# Patient Record
Sex: Female | Born: 1935 | Race: Black or African American | Hispanic: No | Smoking: Never smoker
Health system: Southern US, Community
[De-identification: ages and names within clinical notes are randomized; demographics above are authoritative.]

## PROBLEM LIST (undated history)

## (undated) DIAGNOSIS — M1990 Unspecified osteoarthritis, unspecified site: Secondary | ICD-10-CM

## (undated) DIAGNOSIS — A159 Respiratory tuberculosis unspecified: Secondary | ICD-10-CM

## (undated) DIAGNOSIS — R011 Cardiac murmur, unspecified: Secondary | ICD-10-CM

## (undated) DIAGNOSIS — I10 Essential (primary) hypertension: Secondary | ICD-10-CM

## (undated) HISTORY — DX: Respiratory tuberculosis unspecified: A15.9

---

## 2011-03-10 ENCOUNTER — Ambulatory Visit: Payer: Self-pay | Admitting: Internal Medicine

## 2011-03-16 ENCOUNTER — Ambulatory Visit (INDEPENDENT_AMBULATORY_CARE_PROVIDER_SITE_OTHER): Payer: Medicare Other | Admitting: Internal Medicine

## 2011-03-16 ENCOUNTER — Encounter: Payer: Self-pay | Admitting: Internal Medicine

## 2011-03-16 VITALS — BP 181/92 | HR 102 | Temp 97.9°F | Ht 66.0 in | Wt 143.0 lb

## 2011-03-16 DIAGNOSIS — M069 Rheumatoid arthritis, unspecified: Secondary | ICD-10-CM | POA: Insufficient documentation

## 2011-03-16 NOTE — Progress Notes (Signed)
  Subjective:    Patient ID: Donna Everett, female    DOB: 03-28-1935, 76 y.o.   MRN: [id number]  HPI Donna Everett is a 76 year old female with a history of rheumatoid arthritis on methotrexate WHO COMES TO THE INFECTIOUS DISEASE CLINIC WITH A POSITIVE QuantiFERON test prior to consideration for treatment with a biologic medicine.  She reports a remote history of TB treatment about 40 years ago.  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  Due to her history, she had a yearly CXR that had remained stable.  No fever, no weight loss, no night sweats.  She has not traveled outside of the country, been in jail or exposed to anyone with TB.      Review of Systems  All other systems reviewed and are negative.       Objective:   Physical Exam  Constitutional: She is oriented to person, place, and time. She appears well-developed and well-nourished.  Cardiovascular: Normal rate, regular rhythm and normal heart sounds.   No murmur heard. Pulmonary/Chest: Effort normal and breath sounds normal. No respiratory distress. She has no wheezes. She has no rales.  Abdominal: Soft. Bowel sounds are normal. There is no tenderness.  Musculoskeletal:       Decreased ROM due to arthritis  Lymphadenopathy:    She has no cervical adenopathy.  Neurological: She is alert and oriented to person, place, and time.          Assessment & Plan:

## 2011-03-16 NOTE — Assessment & Plan Note (Signed)
It is unclear if this positive quantiferon test is due to re\re exposure or if she developed latent tuberculosis since she was last treated. Having the positive test does not guide [place] because it may be secondary to previous infection. With her yearly chest x-ray that she reports that she had that for her job and her history of treatment there is no current indication for treatment for latent tuberculosis prior to anti-TNF therapy. Additionally, the patient is reluctant to start anti-TNF therapy and tells me today that she will not be taking it. Regardless, I do not recommend 9 months of INH therapy for latent tuberculosis based on that history. If she does decide to have anti-TNF therapy, she should have a chest x-ray prior to starting her therapy. I have discussed this with the patient at length and she will again discuss with her rheumatologist whether or not she wants to pursue anti-TNF therapy.

## 2020-04-04 ENCOUNTER — Emergency Department (HOSPITAL_COMMUNITY): Payer: Medicare Other

## 2020-04-04 ENCOUNTER — Observation Stay (HOSPITAL_COMMUNITY)
Admission: EM | Admit: 2020-04-04 | Discharge: 2020-04-08 | Disposition: A | Discharge status: Home / Self Care | Payer: Medicare Other | Attending: Internal Medicine | Admitting: Internal Medicine

## 2020-04-04 ENCOUNTER — Other Ambulatory Visit: Payer: Self-pay

## 2020-04-04 DIAGNOSIS — M069 Rheumatoid arthritis, unspecified: Secondary | ICD-10-CM | POA: Insufficient documentation

## 2020-04-04 DIAGNOSIS — E440 Moderate protein-calorie malnutrition: Secondary | ICD-10-CM | POA: Diagnosis not present

## 2020-04-04 DIAGNOSIS — Z79899 Other long term (current) drug therapy: Secondary | ICD-10-CM | POA: Insufficient documentation

## 2020-04-04 DIAGNOSIS — R0789 Other chest pain: Secondary | ICD-10-CM | POA: Diagnosis present

## 2020-04-04 DIAGNOSIS — I10 Essential (primary) hypertension: Secondary | ICD-10-CM

## 2020-04-04 DIAGNOSIS — Z681 Body mass index (BMI) 19 or less, adult: Secondary | ICD-10-CM | POA: Insufficient documentation

## 2020-04-04 DIAGNOSIS — Z20822 Contact with and (suspected) exposure to covid-19: Secondary | ICD-10-CM | POA: Insufficient documentation

## 2020-04-04 DIAGNOSIS — R911 Solitary pulmonary nodule: Secondary | ICD-10-CM

## 2020-04-04 DIAGNOSIS — R011 Cardiac murmur, unspecified: Secondary | ICD-10-CM

## 2020-04-04 DIAGNOSIS — R079 Chest pain, unspecified: Secondary | ICD-10-CM | POA: Diagnosis not present

## 2020-04-04 HISTORY — DX: Cardiac murmur, unspecified: R01.1

## 2020-04-04 HISTORY — DX: Unspecified osteoarthritis, unspecified site: M19.90

## 2020-04-04 HISTORY — DX: Essential (primary) hypertension: I10

## 2020-04-04 LAB — CBC
HCT: 39.8 % (ref 36.0–46.0)
Hemoglobin: 12.3 g/dL (ref 12.0–15.0)
MCH: 28.5 pg (ref 26.0–34.0)
MCHC: 30.9 g/dL (ref 30.0–36.0)
MCV: 92.1 fL (ref 80.0–100.0)
Platelets: 287 10*3/uL (ref 150–400)
RBC: 4.32 MIL/uL (ref 3.87–5.11)
RDW: 12.5 % (ref 11.5–15.5)
WBC: 5.2 10*3/uL (ref 4.0–10.5)
nRBC: 0 % (ref 0.0–0.2)

## 2020-04-04 LAB — D-DIMER, QUANTITATIVE: D-Dimer, Quant: 3.31 ug/mL-FEU — ABNORMAL HIGH (ref 0.00–0.50)

## 2020-04-04 LAB — BASIC METABOLIC PANEL
Anion gap: 11 (ref 5–15)
BUN: 22 mg/dL (ref 8–23)
CO2: 26 mmol/L (ref 22–32)
Calcium: 10.3 mg/dL (ref 8.9–10.3)
Chloride: 100 mmol/L (ref 98–111)
Creatinine, Ser: 0.58 mg/dL (ref 0.44–1.00)
GFR calc Af Amer: >60 mL/min (ref >60)
GFR calc non Af Amer: >60 mL/min (ref >60)
Glucose, Bld: 127 mg/dL — ABNORMAL HIGH (ref 70–99)
Potassium: 3.8 mmol/L (ref 3.5–5.1)
Sodium: 137 mmol/L (ref 135–145)

## 2020-04-04 LAB — TROPONIN I (HIGH SENSITIVITY)
Troponin I (High Sensitivity): 3 ng/L (ref <18)
Troponin I (High Sensitivity): 6 ng/L (ref <18)

## 2020-04-04 MED ORDER — IOHEXOL 350 MG/ML SOLN
75.0000 mL | Freq: Once | INTRAVENOUS | Status: AC | PRN
  Administered 2020-04-04: 75 mL via INTRAVENOUS

## 2020-04-04 NOTE — ED Provider Notes (Signed)
[facility] EMERGENCY DEPARTMENT Provider Note   CSN: [id number] Arrival date & time: 04/04/20  1214     History No chief complaint on file.   Donna Everett is a 85 y.o. female.   Chest Pain Pain location:  L chest and substernal area Pain quality: pressure and sharp   Pain radiates to:  Does not radiate Pain severity:  Moderate Onset quality:  Sudden Timing:  Intermittent Progression:  Waxing and waning Chronicity:  New Context: at rest   Context: not lifting   Relieved by:  Nothing Worsened by:  Nothing Ineffective treatments:  None tried Associated symptoms: nausea   Associated symptoms: no back pain, no cough, no dizziness, no fever, no headache, no palpitations, no shortness of breath and no vomiting   Risk factors: hypertension        Past Medical History:  Diagnosis Date  . Arthritis   . Heart murmur   . Hypertension   . Tuberculosis     Patient Active Problem List   Diagnosis Date Noted  . Chest pain 04/05/2020  . Heart murmur 04/05/2020  . Pulmonary nodule 04/05/2020  . Essential hypertension 04/05/2020  . Rheumatoid arthritis (HCC) 03/16/2011    History reviewed. No pertinent surgical history.   OB History   No obstetric history on file.     History reviewed. No pertinent family history.  Social History   Tobacco Use  . Smoking status: Never Smoker  . Smokeless tobacco: Never Used  Substance Use Topics  . Alcohol use: Never    Comment: occasional wine  . Drug use: No    Home Medications Prior to Admission medications   Medication Sig Start Date End Date Taking? Authorizing Provider  acetaminophen (TYLENOL) 325 MG tablet Take 650 mg by mouth every 6 (six) hours as needed (for pain).   Yes [provider]  aspirin EC 81 MG tablet Take 81 mg by mouth in the morning. Swallow whole.   Yes [provider]  Cholecalciferol (VITAMIN D3) 50 MCG (2000 UT) TABS Take 2,000 Units by mouth in the  morning.   Yes [provider]  diclofenac Sodium (VOLTAREN) 1 % GEL Apply 2 g topically 2 (two) times daily as needed (to affected area for pain).   Yes [provider]  hydrochlorothiazide (MICROZIDE) 12.5 MG capsule Take 12.5 mg by mouth in the morning.   Yes [provider]  [employer] [facility] EX) Apply 1 application topically See admin instructions. Apply to buttocks 2 times a day- after incontinent care   Yes [provider]  NON FORMULARY Take 90 mLs by mouth See admin instructions. MedPass 2.0: Drink 90 ml's by mouth two times a day   Yes [provider]  polyethylene glycol powder (GLYCOLAX/MIRALAX) 17 GM/SCOOP powder Take 17 g by mouth in the morning.   Yes [provider]  simethicone (MYLICON) 80 MG chewable tablet Chew 80 mg by mouth 3 (three) times daily as needed for flatulence (BEFORE MEALS).   Yes [provider]  tizanidine (ZANAFLEX) 2 MG capsule Take 2 mg by mouth at bedtime.   Yes [provider]  trolamine salicylate (ASPERCREME) 10 % cream Apply 1 application topically as needed for muscle pain.   Yes [provider]  folic acid (FOLVITE) 1 MG tablet Take 1 mg by mouth daily.   Patient not taking: Reported on 04/04/2020    [provider]  methotrexate (RHEUMATREX) 15 MG tablet Take 15 mg by mouth  once a week. Caution: Chemotherapy. Protect from light.  Patient not taking: Reported on 04/04/2020    [provider]    Allergies    Erythromycin base, Ascorbate, Aspirin, Doxycycline, Nickel, Codeine, Caffeine, and Ibuprofen  Review of Systems   Review of Systems  Constitutional: Negative for chills and fever.  HENT: Negative for congestion and rhinorrhea.   Respiratory: Negative for cough and shortness of breath.   Cardiovascular: Positive for chest pain. Negative for palpitations.  Gastrointestinal: Positive for nausea. Negative for diarrhea and vomiting.    Genitourinary: Negative for difficulty urinating and dysuria.  Musculoskeletal: Negative for arthralgias and back pain.  Skin: Negative for rash and wound.  Neurological: Negative for dizziness, light-headedness and headaches.    Physical Exam Updated Vital Signs BP (!) 146/76 (BP Location: Right Arm)   Pulse 100   Temp 98.5 F (36.9 C) (Oral)   Resp 16   Ht 5\' 5"  (1.651 m)   Wt 52.4 kg   SpO2 98%   BMI 19.22 kg/m   Physical Exam Vitals and nursing note reviewed. Exam conducted with a chaperone present.  Constitutional:      General: She is not in acute distress.    Appearance: Normal appearance.  HENT:     Head: Normocephalic and atraumatic.     Nose: No rhinorrhea.  Eyes:     General:        Right eye: No discharge.        Left eye: No discharge.     Conjunctiva/sclera: Conjunctivae normal.  Cardiovascular:     Rate and Rhythm: Normal rate and regular rhythm.     Heart sounds: Murmur (2/6 right sternal boarder) heard.   Pulmonary:     Effort: Pulmonary effort is normal. No respiratory distress.     Breath sounds: No stridor. No wheezing.  Abdominal:     General: Abdomen is flat. There is no distension.     Palpations: Abdomen is soft.  Musculoskeletal:        General: No tenderness or signs of injury.  Skin:    General: Skin is warm and dry.  Neurological:     General: No focal deficit present.     Mental Status: She is alert. Mental status is at baseline.     Motor: No weakness.  Psychiatric:        Mood and Affect: Mood normal.        Behavior: Behavior normal.     ED Results / Procedures / Treatments   Labs (all labs ordered are listed, but only abnormal results are displayed) Labs Reviewed  BASIC METABOLIC PANEL - Abnormal; Notable for the following components:      Result Value   Glucose, Bld 127 (*)    All other components within normal limits  D-DIMER, QUANTITATIVE (NOT AT [facility]) - Abnormal; Notable for the following components:   D-Dimer,  Quant 3.31 (*)    All other components within normal limits  SARS CORONAVIRUS 2 BY RT PCR (HOSPITAL ORDER, PERFORMED IN [facility])  CBC  CBC  CREATININE, SERUM  LIPID PANEL  TROPONIN I (HIGH SENSITIVITY)  TROPONIN I (HIGH SENSITIVITY)  TROPONIN I (HIGH SENSITIVITY)  TROPONIN I (HIGH SENSITIVITY)    EKG EKG Interpretation  Date/Time:  Sunday April 04 2020 12:12:38 EDT Ventricular Rate:  107 PR Interval:  146 QRS Duration: 124 QT Interval:  362 QTC Calculation: 483 R Axis:   60 Text Interpretation: Sinus tachycardia Right atrial enlargement Left bundle  branch block Abnormal ECG Confirmed by Cherlynn Perches ([id number]) on 04/04/2020 7:11:19 PM   Radiology DG Chest 2 View  Result Date: 04/04/2020 CLINICAL DATA:  Increased heart rate this morning. Intermittent left-sided chest and axillary pain. No shortness of breath. EXAM: CHEST - 2 VIEW COMPARISON:  None. FINDINGS: The heart, hila, and mediastinum are normal. No pneumothorax. No nodules or masses. Increased interstitial opacities in the lungs are age indeterminate. No focal infiltrate. IMPRESSION: Increased interstitial opacities in the lungs are age indeterminate. Atypical infection could have this appearance. Recommend clinical correlation and attention on short-term follow-up. Electronically Signed   By: Gerome Sam III M.D   On: 04/04/2020 13:02   CT Angio Chest PE W and/or Wo Contrast  Result Date: 04/04/2020 CLINICAL DATA:  Tachycardia, chest pain, elevated D-dimer EXAM: CT ANGIOGRAPHY CHEST WITH CONTRAST TECHNIQUE: Multidetector CT imaging of the chest was performed using the standard protocol during bolus administration of intravenous contrast. Multiplanar CT image reconstructions and MIPs were obtained to evaluate the vascular anatomy. CONTRAST:  9mL OMNIPAQUE IOHEXOL 350 MG/ML SOLN COMPARISON:  None. FINDINGS: Cardiovascular: There is excellent opacification of the pulmonary arterial tree. No intraluminal  filling defect identified to suggest acute pulmonary embolism. The central pulmonary arteries are markedly enlarged in keeping with changes of pulmonary arterial hypertension. There is marked left ventricular hypertrophy with resultant mild global cardiomegaly. No significant coronary artery calcification. No pericardial effusion. The thoracic aorta is of normal caliber. Mild atherosclerotic calcification noted within its descending segment. 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 Arch vasculature is widely patent proximally. Mediastinum/Nodes: No pathologic thoracic adenopathy. Lungs/Pleura: There is bronchiectasis and architectural distortion involving the lingula and left lower lobe with air trapping within the left lower lobe likely related to small airways disease. The focality of these changes suggest a post infectious etiology. Bilateral upper lobe bronchiectasis is also seen time a left severe. No associated nodularity or focal pulmonary infiltrate. A cavitary nodule is seen within the left apex, 9 mm, axial image # 22/6, demonstrating a thin uniform rind, nonspecific. While this may represent a a post infectious or post inflammatory cavity, a cavitary neoplasm is difficult to exclude. 3 mm noncalcified indeterminate pulmonary nodule, right upper lobe, axial image # 69/6. Upper Abdomen: No acute abnormality. Musculoskeletal: No acute bone abnormality Review of the MIP images confirms the above findings. IMPRESSION: No pulmonary embolism. Marked left ventricular hypertrophy. Marked pulmonary arterial enlargement suggesting changes of pulmonary artery hypertension, possibly related to diastolic dysfunction. Multifocal areas of bronchiectasis, most severe within the left lower lobe, likely post infectious in etiology. No superimposed active pulmonary infiltrate identified. Multiple pulmonary nodules. 9 mm cavitary  nodule within the left apex may be post infectious or inflammatory in nature, however, a cavitary neoplasm should be excluded. Consider one of the following in 3 months for both low-risk and high-risk individuals: (a) repeat chest CT, (b) follow-up PET-CT, or (c) tissue sampling. Note that attempt at percutaneous tissue sampling may be unlikely to yield a diagnostic sample given the lack of significant associated soft tissue. This recommendation follows the consensus statement: Guidelines for Management of Incidental Pulmonary Nodules Detected on CT Images: From the Fleischner Society 2017; Radiology 2017; 284:228-243. Aortic Atherosclerosis (ICD10-I70.0). Electronically Signed   By: Helyn Numbers MD   On: 04/04/2020 23:25    Procedures Procedures (including critical care time)  Medications Ordered in ED Medications  hydrochlorothiazide (MICROZIDE) capsule 12.5  mg (12.5 mg Oral Given 04/05/20 0631)  acetaminophen (TYLENOL) tablet 650 mg (has no administration in time range)  ondansetron (ZOFRAN) injection 4 mg (has no administration in time range)  enoxaparin (LOVENOX) injection 40 mg (40 mg Subcutaneous Given 04/05/20 0631)  iohexol (OMNIPAQUE) 350 MG/ML injection 75 mL (75 mLs Intravenous Contrast Given 04/04/20 2256)    ED Course  I have reviewed the triage vital signs and the nursing notes.  Pertinent labs & imaging results that were available during my care of the patient were reviewed by me and considered in my medical decision making (see chart for details).    MDM Rules/Calculators/A&P                          Sudden onset left and mid pressure felt some nausea with no diaphoresis, EKG shows left bundle branch block, based on modified sgarbossa criteria I do not see any signs of new ischemic change, and review of the medical record shows previous left bundle branch block noted on EKG in 2019.  Hear score 5 based on risk factors, troponin III than 6, history of PE secondary to birth  control pills when she was young lady, no leg swelling but initial characteristic of sharp pain could be PE we will get a D-dimer.  My concern is that she could have underlying cardiovascular disease and may need further work-up.  Previous echo noted no significant valvular disease however today I hear a systolic murmur, she may have aortic stenosis.  No infectious signs or symptoms.  Other labs unremarkable.  Chest x-ray reviewed by me and radiology shows no acute abnormality.  Patient's D-dimer is markedly elevated a CT PE study was ordered and reviewed by radiology myself. CT scan shows chronic changes to include the above report. She is made aware of all these findings. With her 2 troponins and hear score of 5, I would recommend admission for further provocative testing and evaluation, she has chronic EKG changes that I cannot compared to prior. She has no cardiology follow-up. I consulted the hospitalist the hospitalist agrees to admit the patient.  Final Clinical Impression(s) / ED Diagnoses Final diagnoses:  Chest pain, unspecified type    Rx / DC Orders ED Discharge Orders    None       Sabino Donovan, MD 04/05/20 [phone]

## 2020-04-04 NOTE — ED Triage Notes (Signed)
Pt to triage via [facility] from [facility].  Reports increased HR this morning and intermittent L sided chest/axillary pain. Denies pain at present.  Denies SOB.  Reported nausea that was relieved with Zofran 4mg  IV by EMS.  22g R hand.

## 2020-04-04 NOTE — ED Notes (Addendum)
Pt back to triage for repeat Trop.  Denies chest pain.  Delay for treatment room explained to pt.

## 2020-04-04 NOTE — ED Notes (Signed)
Patient transported to CT 

## 2020-04-05 ENCOUNTER — Encounter (HOSPITAL_COMMUNITY): Payer: Self-pay | Admitting: Family Medicine

## 2020-04-05 ENCOUNTER — Observation Stay (HOSPITAL_COMMUNITY): Payer: Medicare Other

## 2020-04-05 ENCOUNTER — Observation Stay (HOSPITAL_BASED_OUTPATIENT_CLINIC_OR_DEPARTMENT_OTHER): Payer: Medicare Other

## 2020-04-05 DIAGNOSIS — R079 Chest pain, unspecified: Secondary | ICD-10-CM

## 2020-04-05 DIAGNOSIS — R011 Cardiac murmur, unspecified: Secondary | ICD-10-CM | POA: Diagnosis not present

## 2020-04-05 DIAGNOSIS — M069 Rheumatoid arthritis, unspecified: Secondary | ICD-10-CM

## 2020-04-05 DIAGNOSIS — I10 Essential (primary) hypertension: Secondary | ICD-10-CM

## 2020-04-05 DIAGNOSIS — R911 Solitary pulmonary nodule: Secondary | ICD-10-CM | POA: Diagnosis not present

## 2020-04-05 LAB — CBC
HCT: 37.7 % (ref 36.0–46.0)
Hemoglobin: 12.1 g/dL (ref 12.0–15.0)
MCH: 29.7 pg (ref 26.0–34.0)
MCHC: 32.1 g/dL (ref 30.0–36.0)
MCV: 92.6 fL (ref 80.0–100.0)
Platelets: 196 10*3/uL (ref 150–400)
RBC: 4.07 MIL/uL (ref 3.87–5.11)
RDW: 12.7 % (ref 11.5–15.5)
WBC: 4.5 10*3/uL (ref 4.0–10.5)
nRBC: 0 % (ref 0.0–0.2)

## 2020-04-05 LAB — ECHOCARDIOGRAM COMPLETE
Area-P 1/2: 5.31 cm2
Calc EF: 47.6 %
Height: 65 in
S' Lateral: 2.8 cm
Single Plane A2C EF: 53.3 %
Single Plane A4C EF: 41.5 %
Weight: 1848.34 oz

## 2020-04-05 LAB — LIPID PANEL
Cholesterol: 151 mg/dL (ref 0–200)
HDL: 59 mg/dL (ref >40)
LDL Cholesterol: 80 mg/dL (ref 0–99)
Total CHOL/HDL Ratio: 2.6 RATIO
Triglycerides: 61 mg/dL (ref <150)
VLDL: 12 mg/dL (ref 0–40)

## 2020-04-05 LAB — TROPONIN I (HIGH SENSITIVITY)
Troponin I (High Sensitivity): 6 ng/L (ref <18)
Troponin I (High Sensitivity): 6 ng/L (ref <18)

## 2020-04-05 LAB — SARS CORONAVIRUS 2 BY RT PCR (HOSPITAL ORDER, PERFORMED IN ~~LOC~~ HOSPITAL LAB): SARS Coronavirus 2: NEGATIVE

## 2020-04-05 LAB — CREATININE, SERUM
Creatinine, Ser: 0.64 mg/dL (ref 0.44–1.00)
GFR calc Af Amer: >60 mL/min (ref >60)
GFR calc non Af Amer: >60 mL/min (ref >60)

## 2020-04-05 MED ORDER — ACETAMINOPHEN 325 MG PO TABS: 650.0000 mg | ORAL_TABLET | ORAL | Status: DC | PRN

## 2020-04-05 MED ORDER — HYDROCHLOROTHIAZIDE 12.5 MG PO CAPS
12.5000 mg | ORAL_CAPSULE | Freq: Every morning | ORAL | Status: DC
  Administered 2020-04-05 – 2020-04-08 (×4): 12.5 mg via ORAL
  Filled 2020-04-05 (×4): qty 1

## 2020-04-05 MED ORDER — ENOXAPARIN SODIUM 40 MG/0.4ML ~~LOC~~ SOLN
40.0000 mg | SUBCUTANEOUS | Status: DC
  Administered 2020-04-05 – 2020-04-08 (×4): 40 mg via SUBCUTANEOUS
  Filled 2020-04-05 (×4): qty 0.4

## 2020-04-05 MED ORDER — ONDANSETRON HCL 4 MG/2ML IJ SOLN
4.0000 mg | Freq: Four times a day (QID) | INTRAMUSCULAR | Status: DC | PRN
  Administered 2020-04-07: 4 mg via INTRAVENOUS
  Filled 2020-04-05: qty 2

## 2020-04-05 MED ORDER — ASPIRIN EC 81 MG PO TBEC
81.0000 mg | DELAYED_RELEASE_TABLET | Freq: Every morning | ORAL | Status: DC
  Administered 2020-04-06 – 2020-04-08 (×3): 81 mg via ORAL
  Filled 2020-04-05 (×3): qty 1

## 2020-04-05 NOTE — Progress Notes (Signed)
PROGRESS NOTE    Donna Everett  ONG:[id number] DOB: 13-Oct-1934 DOA: 04/04/2020 PCP: Patient, No Pcp Per    Brief Narrative:  Donna Everett is an 85 year old female with past medical history notable for rheumatoid arthritis, essential hypertension who presented to the ED via EMS from [facility] long-term [facility] facility with complaints of chest pressure.  Notes substernal chest pressure without radiation that is dull in nature with associated shortness of breath, nausea.  Pain has been persistent since onset.  Reports stress test many years ago but no history of cardiac catheterization.  Patient currently resident of long-term care at [facility] since April 2021 for generalized weakness and difficulty caring for herself at home.  In the ED, BP 150/78, HR 95, RR 19, SPO2 99% on room air.  Sodium 137, potassium 3.8, BUN 22, creatinine 0.58, glucose 127.  WBC count 4.5, hemoglobin 12.1, platelets 166.  Covid-19 PCR negative.  Troponin VI, 6, within normal limits.  D-dimer elevated 3.31.  CT angiogram chest negative for PE but with multiple pulmonary nodules, largest 9 mm cavitary nodule left apex consistent with postinfectious versus inflammatory versus cavitary neoplasm.  TRH consulted for further evaluation and management of chest pain.   Assessment & Plan:   Principal Problem:   Chest pain Active Problems:   Rheumatoid arthritis (HCC)   Heart murmur   Pulmonary nodule   Essential hypertension   Atypical chest pain Patient presenting to the ED via EMS following acute onset chest pressure without radiation that has been persistent.  EKG with left bundle branch block which is old and no ST elevation/depression or concerning T wave inversion.  Troponin have been negative.  EKG unrevealing.  Lipid panel with total cholesterol 151, HDL 59, LDL 80, triglycerides 61. --Echocardiogram: Pending --Nuclear med stress test: Pending --Continue aspirin 81 mg p.o. daily --Continue monitor  on telemetry  Pulmonary nodules CT angiogram chest notable for multiple pulmonary nodules, largest 9 mm cavitary left apex consistent with postinfectious versus inflammatory versus cavitary neoplasm.  Patient well aware of her pulmonary nodules, history of sarcoidosis has been followed in the distant past by her PCP.  Radiology recommending interval follow-up 101-month CT chest versus PET CT versus tissue sample.  But unable to perform percutaneous sample due to lack of soft tissue per their report. --Will place ambulatory referral to pulmonology for further evaluation/surveillance following discharge  Essential hypertension --Continue HCTZ 12.5 mg p.o. daily  Generalized weakness, debility: Patient has been a resident of [facility] since April 2021 for generalized weakness, debility and unable to care for self.  She states therapy has not worked with her over the past 1-2 months; and she would like to get more ambulatory. --PT/OT evaluation  Moderate protein calorie malnutrition Body mass index is 19.22 kg/m. --Nutrition consult   DVT prophylaxis: SCDs, Lovenox Code Status: Full code Family Communication: Updated patient extensively at bedside  Disposition Plan:  Status is: Observation  The patient remains OBS appropriate and will d/c before 2 midnights.  Dispo: The patient is from: SNF              Anticipated d/c is to: SNF              Anticipated d/c date is: 1 day              Patient currently is not medically stable to d/c.   Consultants:   None  Procedures:   Transthoracic echocardiogram: Pending  Nuclear medicine stress test: Pending  Antimicrobials:  None   Subjective: Patient seen and examined at bedside, resting comfortably.  Denies any current chest pain at this time.  Awaiting TTE and nuclear medicine stress test.  Patient highly concerned about lack of therapy given at [facility] currently, she is noticed a lack of assistance with decreased  staffing since Covid-19 surge which is currently ongoing.  No other complaints or concerns at this time.  Denies headache, no dizziness, no chest pain, no shortness of breath, no palpitations, no abdominal pain.  No acute events overnight per nursing staff.  Objective: Vitals:   04/05/20 0145 04/05/20 0207 04/05/20 0242 04/05/20 0536  BP: 117/60  140/74 (!) 146/76  Pulse: 72  72 100  Resp: 13  16 16   Temp:  97.7 F (36.5 C) 97.7 F (36.5 C) 98.5 F (36.9 C)  TempSrc:  Oral Oral Oral  SpO2: 100%  100% 98%  Weight:   52.4 kg   Height:       No intake or output data in the 24 hours ending 04/05/20 1110 Filed Weights   04/04/20 1607 04/05/20 0242  Weight: 52.6 kg 52.4 kg    Examination:  General exam: Appears calm and comfortable, thin in appearance Respiratory system: Clear to auscultation. Respiratory effort normal.  Oxygenating well on room air Cardiovascular system: S1 & S2 heard, RRR. No JVD, murmurs, rubs, gallops or clicks. No pedal edema. Gastrointestinal system: Abdomen is nondistended, soft and nontender. No organomegaly or masses felt. Normal bowel sounds heard. Central nervous system: Alert and oriented. No focal neurological deficits. Extremities: Symmetric 5 x 5 power. Skin: No rashes, lesions or ulcers Psychiatry: Judgement and insight appear normal. Mood & affect appropriate.     Data Reviewed: I have personally reviewed following labs and imaging studies  CBC: Recent Labs  Lab 04/04/20 1240 04/05/20 0246  WBC 5.2 4.5  HGB 12.3 12.1  HCT 39.8 37.7  MCV 92.1 92.6  PLT 287 196   Basic Metabolic Panel: Recent Labs  Lab 04/04/20 1240 04/05/20 0246  NA 137  --   K 3.8  --   CL 100  --   CO2 26  --   GLUCOSE 127*  --   BUN 22  --   CREATININE 0.58 0.64  CALCIUM 10.3  --    GFR: Estimated Creatinine Clearance: 44.1 mL/min (by C-G formula based on SCr of 0.64 mg/dL). Liver Function Tests: No results for input(s): AST, ALT, ALKPHOS, BILITOT, PROT,  ALBUMIN in the last 168 hours. No results for input(s): LIPASE, AMYLASE in the last 168 hours. No results for input(s): AMMONIA in the last 168 hours. Coagulation Profile: No results for input(s): INR, PROTIME in the last 168 hours. Cardiac Enzymes: No results for input(s): CKTOTAL, CKMB, CKMBINDEX, TROPONINI in the last 168 hours. BNP (last 3 results) No results for input(s): PROBNP in the last 8760 hours. HbA1C: No results for input(s): HGBA1C in the last 72 hours. CBG: No results for input(s): GLUCAP in the last 168 hours. Lipid Profile: Recent Labs    04/05/20 0246  CHOL 151  HDL 59  LDLCALC 80  TRIG 61  CHOLHDL 2.6   Thyroid Function Tests: No results for input(s): TSH, T4TOTAL, FREET4, T3FREE, THYROIDAB in the last 72 hours. Anemia Panel: No results for input(s): VITAMINB12, FOLATE, FERRITIN, TIBC, IRON, RETICCTPCT in the last 72 hours. Sepsis Labs: No results for input(s): PROCALCITON, LATICACIDVEN in the last 168 hours.  Recent Results (from the past 240 hour(s))  SARS Coronavirus 2 by RT PCR (  hospital order, performed in [facility] hospital lab) Nasopharyngeal Nasopharyngeal Swab     Status: None   Collection Time: 04/05/20 12:05 AM   Specimen: Nasopharyngeal Swab  Result Value Ref Range Status   SARS Coronavirus 2 NEGATIVE NEGATIVE Final    Comment: (NOTE) SARS-CoV-2 target nucleic acids are NOT DETECTED.  The SARS-CoV-2 RNA is generally detectable in upper and lower respiratory specimens during the acute phase of infection. The lowest concentration of SARS-CoV-2 viral copies this assay can detect is 250 copies / mL. A negative result does not preclude SARS-CoV-2 infection and should not be used as the sole basis for treatment or other patient management decisions.  A negative result may occur with improper specimen collection / handling, submission of specimen other than nasopharyngeal swab, presence of viral mutation(s) within the areas targeted by this  assay, and inadequate number of viral copies (<250 copies / mL). A negative result must be combined with clinical observations, patient history, and epidemiological information.  Fact Sheet for Patients:   BoilerBrush.com.cy  Fact Sheet for Healthcare Providers: https://pope.com/  This test is not yet approved or  cleared by the [place] FDA and has been authorized for detection and/or diagnosis of SARS-CoV-2 by FDA under an Emergency Use Authorization (EUA).  This EUA will remain in effect (meaning this test can be used) for the duration of the COVID-19 declaration under Section 564(b)(1) of the Act, 21 U.S.C. section 360bbb-3(b)(1), unless the authorization is terminated or revoked sooner.  Performed at [facility], [address] [place], [place] [address]          Radiology Studies: DG Chest 2 View  Result Date: 04/04/2020 CLINICAL DATA:  Increased heart rate this morning. Intermittent left-sided chest and axillary pain. No shortness of breath. EXAM: CHEST - 2 VIEW COMPARISON:  None. FINDINGS: The heart, hila, and mediastinum are normal. No pneumothorax. No nodules or masses. Increased interstitial opacities in the lungs are age indeterminate. No focal infiltrate. IMPRESSION: Increased interstitial opacities in the lungs are age indeterminate. Atypical infection could have this appearance. Recommend clinical correlation and attention on short-term follow-up. Electronically Signed   By: Gerome Sam III M.D   On: 04/04/2020 13:02   CT Angio Chest PE W and/or Wo Contrast  Result Date: 04/04/2020 CLINICAL DATA:  Tachycardia, chest pain, elevated D-dimer EXAM: CT ANGIOGRAPHY CHEST WITH CONTRAST TECHNIQUE: Multidetector CT imaging of the chest was performed using the standard protocol during bolus administration of intravenous contrast. Multiplanar CT image reconstructions and MIPs were obtained to evaluate the vascular  anatomy. CONTRAST:  63mL OMNIPAQUE IOHEXOL 350 MG/ML SOLN COMPARISON:  None. FINDINGS: Cardiovascular: There is excellent opacification of the pulmonary arterial tree. No intraluminal filling defect identified to suggest acute pulmonary embolism. The central pulmonary arteries are markedly enlarged in keeping with changes of pulmonary arterial hypertension. There is marked left ventricular hypertrophy with resultant mild global cardiomegaly. No significant coronary artery calcification. No pericardial effusion. The thoracic aorta is of normal caliber. Mild atherosclerotic calcification noted within its descending segment. 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 Arch vasculature is widely patent proximally. Mediastinum/Nodes: No pathologic thoracic adenopathy. Lungs/Pleura: There is bronchiectasis and architectural distortion involving the lingula and left lower lobe with air trapping within the left lower lobe likely related to small airways disease. The focality of these changes suggest a post infectious etiology. Bilateral upper lobe bronchiectasis is also seen time  a left severe. No associated nodularity or focal pulmonary infiltrate. A cavitary nodule is seen within the left apex, 9 mm, axial image # 22/6, demonstrating a thin uniform rind, nonspecific. While this may represent a a post infectious or post inflammatory cavity, a cavitary neoplasm is difficult to exclude. 3 mm noncalcified indeterminate pulmonary nodule, right upper lobe, axial image # 69/6. Upper Abdomen: No acute abnormality. Musculoskeletal: No acute bone abnormality Review of the MIP images confirms the above findings. IMPRESSION: No pulmonary embolism. Marked left ventricular hypertrophy. Marked pulmonary arterial enlargement suggesting changes of pulmonary artery hypertension, possibly related to diastolic dysfunction. Multifocal areas of  bronchiectasis, most severe within the left lower lobe, likely post infectious in etiology. No superimposed active pulmonary infiltrate identified. Multiple pulmonary nodules. 9 mm cavitary nodule within the left apex may be post infectious or inflammatory in nature, however, a cavitary neoplasm should be excluded. Consider one of the following in 3 months for both low-risk and high-risk individuals: (a) repeat chest CT, (b) follow-up PET-CT, or (c) tissue sampling. Note that attempt at percutaneous tissue sampling may be unlikely to yield a diagnostic sample given the lack of significant associated soft tissue. This recommendation follows the consensus statement: Guidelines for Management of Incidental Pulmonary Nodules Detected on CT Images: From the Fleischner Society 2017; Radiology 2017; 284:228-243. Aortic Atherosclerosis (ICD10-I70.0). Electronically Signed   By: Helyn Numbers MD   On: 04/04/2020 23:25        Scheduled Meds:  enoxaparin (LOVENOX) injection  40 mg Subcutaneous Q24H   hydrochlorothiazide  12.5 mg Oral q AM   Continuous Infusions:   LOS: 0 days    Time spent: 37 minutes spent on chart review, discussion with nursing staff, consultants, updating family and interview/physical exam; more than 50% of that time was spent in counseling and/or coordination of care.    Alvira Philips [place], DO [facility] Available via Epic secure chat 7am-7pm After these hours, please refer to coverage provider listed on [email or web address] 04/05/2020, 11:10 AM

## 2020-04-05 NOTE — NC FL2 (Signed)
  [place] MEDICAID FL2 LEVEL OF CARE SCREENING TOOL     IDENTIFICATION  Patient Name: Donna Everett Birthdate: 11-Feb-1935 Sex: female Admission Date (Current Location): 04/04/2020  [facility] and [place] Number:  Producer, television/film/video and Address:  [facility], [address] [place], [place] [address]      Provider Number: [id number]  Attending Physician Name and Address:  [place], Alvira Philips, DO  Relative Name and Phone Number:       Current Level of Care: Hospital Recommended Level of Care: Skilled Nursing Facility Prior Approval Number:    Date Approved/Denied:   PASRR Number: [id number]  Discharge Plan: SNF    Current Diagnoses: Patient Active Problem List   Diagnosis Date Noted  . Chest pain 04/05/2020  . Heart murmur 04/05/2020  . Pulmonary nodule 04/05/2020  . Essential hypertension 04/05/2020  . Rheumatoid arthritis (HCC) 03/16/2011    Orientation RESPIRATION BLADDER Height & Weight     Self, Time, Situation, Place  Normal Incontinent, External catheter Weight: 115 lb 8.3 oz (52.4 kg) Height:  5\' 5"  (165.1 cm)  BEHAVIORAL SYMPTOMS/MOOD NEUROLOGICAL BOWEL NUTRITION STATUS      Continent Diet (see discharge summary)  AMBULATORY STATUS COMMUNICATION OF NEEDS Skin   Extensive Assist Verbally Normal (dry;flaky)                       Personal Care Assistance Level of Assistance  Bathing, Feeding, Dressing Bathing Assistance: Maximum assistance Feeding assistance: Independent Dressing Assistance: Maximum assistance     Functional Limitations Info  Sight, Hearing, Speech Sight Info: Adequate Hearing Info: Adequate Speech Info: Adequate    SPECIAL CARE FACTORS FREQUENCY  PT (By licensed PT), OT (By licensed OT)     PT Frequency: 5x week OT Frequency: 5x week            Contractures Contractures Info: Not present    Additional Factors Info  Code Status, Allergies Code Status Info: Full Code Allergies Info:  Erythromycin Base, Ascorbate, Aspirin, Doxycycline, Nickel, Codeine, Caffeine, Ibuprofen           Current Medications (04/05/2020):  This is the current hospital active medication list Current Facility-Administered Medications  Medication Dose Route Frequency Provider Last Rate Last Admin  . acetaminophen (TYLENOL) tablet 650 mg  650 mg Oral Q4H PRN Chotiner, 04/07/2020, MD      . enoxaparin (LOVENOX) injection 40 mg  40 mg Subcutaneous Q24H Chotiner, Claudean Severance, MD   40 mg at 04/05/20 0631  . hydrochlorothiazide (MICROZIDE) capsule 12.5 mg  12.5 mg Oral q AM Chotiner, 04/07/20, MD   12.5 mg at 04/05/20 0631  . ondansetron (ZOFRAN) injection 4 mg  4 mg Intravenous Q6H PRN Chotiner, 04/07/20, MD         Discharge Medications: Please see discharge summary for a list of discharge medications.  Relevant Imaging Results:  Relevant Lab Results:   Additional Information SS#[id number] [place], [place]

## 2020-04-05 NOTE — H&P (Signed)
History and Physical    Donna Everett CHJ:[id number] DOB: 16-Jun-1935 DOA: 04/04/2020  PCP: Patient, No Pcp Per   Patient coming from: Local skilled nursing facility  Chief Complaint: Chest pressure  HPI: Donna Everett is a 85 y.o. female with medical history significant for arthritis, hypertension presents by EMS from a skilled nursing facility with complaint of chest pressure.  She reports that she has developed a substernal chest pressure that does not radiate.  She reports it is a dull pressure-like sensation like something heavy sitting on her chest.  She has had some mild shortness of breath and nausea associated with the chest pressure.  Symptoms began in the afternoon and is continued into the evening she became concerned and called 911.  She states that she had a stress test many years ago but has never had a cardiac catheterization done.  She has been in rehab since April secondary to generalized weakness and having difficulty caring for self at home.  She reports that due to the Covid 19 pandemic services have not been staffed and this is caused her to be at the rehab center for a prolonged period of time.  She states she has not had any fever, cough, chills, vomiting, diarrhea, abdominal pain, urinary symptoms.  She does not take any medicines for the chest pressure today.  States that she does take an aspirin every day.  She does have a history of a heart murmur but states she has never had an echocardiogram. She denies any tobacco, alcohol, illicit drug use.  ED Course: [place] has negative troponin in the emergency room and an EKG that shows left bundle branch block but does not have any acute ST changes.  She has an elevated heart score and with her symptoms and risk factors hospital service been asked to observe patient and do a more thorough cardiac work-up  Review of Systems:  General: Denies fever, chills, weight loss, night sweats.  Denies dizziness.  Denies change in  appetite HENT: Denies head trauma, headache, denies change in hearing, tinnitus.  Denies nasal congestion or bleeding.  Denies sore throat, sores in mouth.  Denies difficulty swallowing Eyes: Denies blurry vision, pain in eye, drainage.  Denies discoloration of eyes. Neck: Denies pain.  Denies swelling.  Denies pain with movement. Cardiovascular: Reports substernal chest pressure.  No palpitations.  Denies edema.  Denies orthopnea Respiratory: Reports shortness of breath with exertion.  No cough.  Denies wheezing.  Denies sputum production Gastrointestinal: Denies abdominal pain, swelling.  Reports mild nausea chest pressure but no vomiting, diarrhea.  Denies melena.  Denies hematemesis. Musculoskeletal: Denies limitation of movement.  Denies deformity or swelling.  Denies pain.  Denies arthralgias or myalgias. Genitourinary: Denies pelvic pain.  Denies urinary frequency or hesitancy.  Denies dysuria.  Skin: Denies rash.  Denies petechiae, purpura, ecchymosis. Neurological: Denies headache. Denies syncope. Denies seizure activity. Denies paresthesia. Denies slurred speech, drooping face.  Denies visual change. Psychiatric: Denies depression, anxiety.  Denies suicidal thoughts or ideation.  Denies hallucinations.  Past Medical History:  Diagnosis Date  . Arthritis   . Heart murmur   . Hypertension   . Tuberculosis     History reviewed. No pertinent surgical history.  Social History  reports that she has never smoked. She has never used smokeless tobacco. She reports that she does not drink alcohol and does not use drugs.  Allergies  Allergen Reactions  . Erythromycin Base Swelling and Other (See Comments)    Edema in hands,  not throat  . Ascorbate Other (See Comments)    Urinary frequency  . Aspirin Other (See Comments)    "Allergic," per MAR  . Doxycycline Other (See Comments)    "Stomach spasms, sick , light-headed"  . Nickel Dermatitis  . Codeine Other (See Comments)     "Allergic," per MAR  . Caffeine Anxiety and Other (See Comments)    "very jittery"  . Ibuprofen Other (See Comments)    Cannot take due to liver problems, hepatitis    History reviewed. No pertinent family history.   Prior to Admission medications   Medication Sig Start Date End Date Taking? Authorizing Provider  acetaminophen (TYLENOL) 325 MG tablet Take 650 mg by mouth every 6 (six) hours as needed (for pain).   Yes [provider]  aspirin EC 81 MG tablet Take 81 mg by mouth in the morning. Swallow whole.   Yes [provider]  Cholecalciferol (VITAMIN D3) 50 MCG (2000 UT) TABS Take 2,000 Units by mouth in the morning.   Yes [provider]  diclofenac Sodium (VOLTAREN) 1 % GEL Apply 2 g topically 2 (two) times daily as needed (to affected area for pain).   Yes [provider]  hydrochlorothiazide (MICROZIDE) 12.5 MG capsule Take 12.5 mg by mouth in the morning.   Yes [provider]  Infant Care Products [facility] EX) Apply 1 application topically See admin instructions. Apply to buttocks 2 times a day- after incontinent care   Yes [provider]  NON FORMULARY Take 90 mLs by mouth See admin instructions. MedPass 2.0: Drink 90 ml's by mouth two times a day   Yes [provider]  polyethylene glycol powder (GLYCOLAX/MIRALAX) 17 GM/SCOOP powder Take 17 g by mouth in the morning.   Yes [provider]  simethicone (MYLICON) 80 MG chewable tablet Chew 80 mg by mouth 3 (three) times daily as needed for flatulence (BEFORE MEALS).   Yes [provider]  tizanidine (ZANAFLEX) 2 MG capsule Take 2 mg by mouth at bedtime.   Yes [provider]  trolamine salicylate (ASPERCREME) 10 % cream Apply 1 application topically as needed for muscle pain.   Yes [provider]  folic acid (FOLVITE) 1 MG tablet Take 1 mg by mouth daily.   Patient not taking: Reported on 04/04/2020    [provider]    methotrexate (RHEUMATREX) 15 MG tablet Take 15 mg by mouth once a week. Caution: Chemotherapy. Protect from light.  Patient not taking: Reported on 04/04/2020    [provider]    Physical Exam: Vitals:   04/04/20 1815 04/04/20 1830 04/04/20 2100 04/05/20 0009  BP: (!) 150/78 (!) 155/70 130/77 132/63  Pulse: 95 96 84 71  Resp: 19 18 14 11   Temp:      TempSrc:      SpO2: 99% 98% 99% 99%  Weight:      Height:        Constitutional: NAD, calm, comfortable Vitals:   04/04/20 1815 04/04/20 1830 04/04/20 2100 04/05/20 0009  BP: (!) 150/78 (!) 155/70 130/77 132/63  Pulse: 95 96 84 71  Resp: 19 18 14 11   Temp:      TempSrc:      SpO2: 99% 98% 99% 99%  Weight:      Height:       General: WDWN, pleasant elderly African-American female.  Alert and oriented x3.  Eyes: EOMI, PERRL, lids and conjunctivae normal.  Sclera nonicteric HENT:  /AT, external  ears normal.  Nares patent without epistasis.  Mucous membranes are moist. Posterior pharynx clear of any exudate or lesions.  Neck: Soft, normal range of motion, supple, no masses, no thyromegaly.  Trachea midline Respiratory: clear to auscultation bilaterally, no wheezing, no crackles. Normal respiratory effort. No accessory muscle use.  Cardiovascular: Regular rate and rhythm, 3/6 systolic murmurs left sternal border.  No rubs / gallops. No extremity edema. 2+ pedal pulses. No carotid bruits.  Abdomen: Soft, no tenderness, nondistended, no rebound or guarding.  No masses palpated. No hepatosplenomegaly. Bowel sounds normoactive Musculoskeletal: FROM. no clubbing / cyanosis.  Thickened joints of hands, fingers and wrists. Normal muscle tone.  Skin: Warm, dry, intact no rashes, lesions, ulcers. No induration Neurologic: CN 2-12 grossly intact.  Normal speech.  Sensation intact, patella DTR +1 bilaterally. Strength 4/5 in all extremities.   Psychiatric: Normal judgment and insight.  Normal mood.   Heart score: 5   Labs on  Admission: I have personally reviewed following labs and imaging studies  CBC: Recent Labs  Lab 04/04/20 1240  WBC 5.2  HGB 12.3  HCT 39.8  MCV 92.1  PLT 287    Basic Metabolic Panel: Recent Labs  Lab 04/04/20 1240  NA 137  K 3.8  CL 100  CO2 26  GLUCOSE 127*  BUN 22  CREATININE 0.58  CALCIUM 10.3    GFR: Estimated Creatinine Clearance: 44.2 mL/min (by C-G formula based on SCr of 0.58 mg/dL).  Liver Function Tests: No results for input(s): AST, ALT, ALKPHOS, BILITOT, PROT, ALBUMIN in the last 168 hours.  Urine analysis: No results found for: COLORURINE, APPEARANCEUR, LABSPEC, PHURINE, GLUCOSEU, HGBUR, BILIRUBINUR, KETONESUR, PROTEINUR, UROBILINOGEN, NITRITE, LEUKOCYTESUR  Radiological Exams on Admission: DG Chest 2 View  Result Date: 04/04/2020 CLINICAL DATA:  Increased heart rate this morning. Intermittent left-sided chest and axillary pain. No shortness of breath. EXAM: CHEST - 2 VIEW COMPARISON:  None. FINDINGS: The heart, hila, and mediastinum are normal. No pneumothorax. No nodules or masses. Increased interstitial opacities in the lungs are age indeterminate. No focal infiltrate. IMPRESSION: Increased interstitial opacities in the lungs are age indeterminate. Atypical infection could have this appearance. Recommend clinical correlation and attention on short-term follow-up. Electronically Signed   By: Gerome Sam III M.D   On: 04/04/2020 13:02   CT Angio Chest PE W and/or Wo Contrast  Result Date: 04/04/2020 CLINICAL DATA:  Tachycardia, chest pain, elevated D-dimer EXAM: CT ANGIOGRAPHY CHEST WITH CONTRAST TECHNIQUE: Multidetector CT imaging of the chest was performed using the standard protocol during bolus administration of intravenous contrast. Multiplanar CT image reconstructions and MIPs were obtained to evaluate the vascular anatomy. CONTRAST:  65mL OMNIPAQUE IOHEXOL 350 MG/ML SOLN COMPARISON:  None. FINDINGS: Cardiovascular: There is excellent opacification  of the pulmonary arterial tree. No intraluminal filling defect identified to suggest acute pulmonary embolism. The central pulmonary arteries are markedly enlarged in keeping with changes of pulmonary arterial hypertension. There is marked left ventricular hypertrophy with resultant mild global cardiomegaly. No significant coronary artery calcification. No pericardial effusion. The thoracic aorta is of normal caliber. Mild atherosclerotic calcification noted within its descending segment. 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 Arch vasculature is widely patent proximally. Mediastinum/Nodes: No pathologic thoracic adenopathy. Lungs/Pleura: There is bronchiectasis and architectural distortion involving the lingula and left lower lobe with air trapping within the left lower lobe likely related to small airways disease. The focality of these  changes suggest a post infectious etiology. Bilateral upper lobe bronchiectasis is also seen time a left severe. No associated nodularity or focal pulmonary infiltrate. A cavitary nodule is seen within the left apex, 9 mm, axial image # 22/6, demonstrating a thin uniform rind, nonspecific. While this may represent a a post infectious or post inflammatory cavity, a cavitary neoplasm is difficult to exclude. 3 mm noncalcified indeterminate pulmonary nodule, right upper lobe, axial image # 69/6. Upper Abdomen: No acute abnormality. Musculoskeletal: No acute bone abnormality Review of the MIP images confirms the above findings. IMPRESSION: No pulmonary embolism. Marked left ventricular hypertrophy. Marked pulmonary arterial enlargement suggesting changes of pulmonary artery hypertension, possibly related to diastolic dysfunction. Multifocal areas of bronchiectasis, most severe within the left lower lobe, likely post infectious in etiology. No superimposed active pulmonary infiltrate  identified. Multiple pulmonary nodules. 9 mm cavitary nodule within the left apex may be post infectious or inflammatory in nature, however, a cavitary neoplasm should be excluded. Consider one of the following in 3 months for both low-risk and high-risk individuals: (a) repeat chest CT, (b) follow-up PET-CT, or (c) tissue sampling. Note that attempt at percutaneous tissue sampling may be unlikely to yield a diagnostic sample given the lack of significant associated soft tissue. This recommendation follows the consensus statement: Guidelines for Management of Incidental Pulmonary Nodules Detected on CT Images: From the Fleischner Society 2017; Radiology 2017; 284:228-243. Aortic Atherosclerosis (ICD10-I70.0). Electronically Signed   By: Helyn Numbers MD   On: 04/04/2020 23:25    EKG: Independently reviewed.  Shows sinus tachycardia with atrial enlargement.  Left bundle branch block.  ST changes with no acute ST elevation or depression.  Assessment/Plan Principal Problem:   Chest pain Patient replaced on cardiac telemetry for observation for chest pain.  Obtain serial troponin levels.  If troponins remain negative we will proceed with stress test.  If troponins become positive will consult cardiology.  Antiplatelet therapy with aspirin daily.  Check lipid panel.  Monitor blood pressure.  Nitroglycerin as needed.  Pulmonal oxygen as needed to maintain O2 sat between 92-96%  Active Problems:   Rheumatoid arthritis (HCC) Chronic stable condition.  RA is a risk factor for cardiovascular disease.    Heart murmur 3/6 systolic ejection murmur.  Obtain echocardiogram to further evaluate valvular structure, wall motion and EF    Pulmonary nodule In his pulmonary nodule on CT scan.  Will need repeat CT scan in 3 months to document stability versus having a PET scan or biopsy obtained    Essential hypertension Continue hydrochlorothiazide.  Monitor blood pressure    DVT prophylaxis: Lovenox for DVT  prophylaxis Code Status:   Full code Family Communication:  Diagnosis and plan discussed with patient.  Patient verbalized understanding agrees with plan.  Further recommendations to follow as clinically indicated Disposition Plan:   Patient is from:  Skilled nursing facility  Anticipated DC to:  Skilled nursing facility for continued rehab  Anticipated DC date:  Anticipate less than 2 midnight stay  Anticipated DC barriers: No barriers to discharge identified at this time   Admission status:  Observation  Severity of Illness: The appropriate patient status for this patient is OBSERVATION. Observation status is judged to be reasonable and necessary in order to provide the required intensity of service to ensure the patient's safety. The patient's presenting symptoms, physical exam findings, and initial radiographic and laboratory data in the context of their medical condition is felt to place them at decreased risk for further clinical deterioration.  Furthermore, it is anticipated that the patient will be medically stable for discharge from the hospital within 2 midnights of admission. The following factors support the patient status of observation.     Donna Severance Derrich Gaby MD [facility]  How to contact the [facility] Attending or Consulting provider 7A - 7P or covering provider during after hours 7P -7A, for this patient?   1. Check the care team in [facility] and look for a) attending/consulting TR[facility] provider listed and b) the [facility] team listed 2. Log into [email or web address] and use [place]'s universal password to access. If you do not have the password, please contact the hospital operator. 3. Locate the [facility], The provider you are looking for under [facility] and page to a number that you can be directly reached. 4. If you still have difficulty reaching the provider, please page the [facility] (Director on Call) for the Hospitalists listed on amion for assistance.  04/05/2020, 12:50 AM

## 2020-04-05 NOTE — Progress Notes (Signed)
  Echocardiogram 2D Echocardiogram has been performed.  Donna Everett 04/05/2020, 11:11 AM

## 2020-04-05 NOTE — Evaluation (Signed)
Physical Therapy Evaluation Patient Details Name: Donna Everett MRN: [id number] DOB: 01/16/1935 Today's Date: 04/05/2020   History of Present Illness  Pt is an 85 y/o female admitted from SNF secondary to chest pain. Workup pending. PMH includes RA and HTN.   Clinical Impression  Pt admitted secondary to problem above with problem above. Pt requiring supervision for bed mobility. Attempted to stand, however, only able to achieve minimal clearance from surface with total A. Performed passive stretch to bilateral hamstrings X5 this session. Pt would like to receive therapy services upon return to SNF. Will continue to follow acutely to maximize functional mobility independence and safety.     Follow Up Recommendations SNF    Equipment Recommendations  None recommended by PT    Recommendations for Other Services       Precautions / Restrictions Precautions Precautions: Fall Restrictions Weight Bearing Restrictions: No      Mobility  Bed Mobility Overal bed mobility: Needs Assistance Bed Mobility: Sit to Supine       Sit to supine: Supervision   General bed mobility comments: Supervision for safety to return to supine. Pt sitting at EOB upon entry.   Transfers                 General transfer comment: Attempted to stand X2 with total A, however, only achieving minimal clearance of hips.   Ambulation/Gait                Stairs            Wheelchair Mobility    Modified Rankin (Stroke Patients Only)       Balance Overall balance assessment: Needs assistance Sitting-balance support: No upper extremity supported;Feet supported Sitting balance-Leahy Scale: Fair         Standing balance comment: unable to come to standing with +1 assist.                              Pertinent Vitals/Pain Pain Assessment: Faces Faces Pain Scale: Hurts even more Pain Location: L knee Pain Descriptors / Indicators: Grimacing;Guarding Pain  Intervention(s): Limited activity within patient's tolerance;Monitored during session;Repositioned    Home Living Family/patient expects to be discharged to:: Skilled nursing facility                      Prior Function Level of Independence: Needs assistance   Gait / Transfers Assistance Needed: Used WC when OOB   ADL's / Homemaking Assistance Needed: Needs assist with ADLs.         Hand Dominance        Extremity/Trunk Assessment   Upper Extremity Assessment Upper Extremity Assessment: Defer to OT evaluation    Lower Extremity Assessment Lower Extremity Assessment: Generalized weakness;RLE deficits/detail;LLE deficits/detail RLE Deficits / Details: Pt lacking ~15-20 deg knee extension on the RLE  LLE Deficits / Details: Lacking ~10 degrees of knee extension on LLE. Reports pain in L knee.     Cervical / Trunk Assessment Cervical / Trunk Assessment: Kyphotic  Communication   Communication: No difficulties  Cognition Arousal/Alertness: Awake/alert Behavior During Therapy: WFL for tasks assessed/performed Overall Cognitive Status: Within Functional Limits for tasks assessed                                        General Comments  Exercises Other Exercises Other Exercises: Passive seated hamstring stretch bilaterally X5.    Assessment/Plan    PT Assessment Patient needs continued PT services  PT Problem List Decreased strength;Decreased balance;Decreased mobility;Decreased knowledge of use of DME;Decreased knowledge of precautions;Decreased range of motion       PT Treatment Interventions DME instruction;Gait training;Stair training;Functional mobility training;Therapeutic activities;Therapeutic exercise;Balance training;Patient/family education    PT Goals (Current goals can be found in the Care Plan section)  Acute Rehab PT Goals Patient Stated Goal: to get PT upon return to SNF.  PT Goal Formulation: With patient Time For Goal  Achievement: 04/19/20 Potential to Achieve Goals: Fair    Frequency Min 2X/week   Barriers to discharge        Co-evaluation               AM-PAC PT "6 Clicks" Mobility  Outcome Measure Help needed turning from your back to your side while in a flat bed without using bedrails?: A Little Help needed moving from lying on your back to sitting on the side of a flat bed without using bedrails?: A Little Help needed moving to and from a bed to a chair (including a wheelchair)?: Total Help needed standing up from a chair using your arms (e.g., wheelchair or bedside chair)?: Total Help needed to walk in hospital room?: Total Help needed climbing 3-5 steps with a railing? : Total 6 Click Score: 10    End of Session Equipment Utilized During Treatment: Gait belt Activity Tolerance: Patient tolerated treatment well Patient left: in bed;with call bell/phone within reach;with bed alarm set Nurse Communication: Mobility status PT Visit Diagnosis: Unsteadiness on feet (R26.81);Muscle weakness (generalized) (M62.81);Difficulty in walking, not elsewhere classified (R26.2)    Time: 9604-5409 PT Time Calculation (min) (ACUTE ONLY): 18 min   Charges:   PT Evaluation $PT Eval Moderate Complexity: 1 Mod          Farley Ly, PT, DPT  Acute Rehabilitation Services  Pager: [phone] Office: [phone]   Lehman Prom 04/05/2020, 5:16 PM

## 2020-04-05 NOTE — TOC Initial Note (Signed)
Transition of Care [facility]) - Initial/Assessment Note    Patient Details  Name: Donna Everett MRN: [id number] Date of Birth: 25-Apr-1935  Transition [facility]) CM/SW Contact:    Alexander Mt, LCSW Phone Number: 04/05/2020, 1:54 PM  Clinical Narrative:                 CSW met with pt at bedside. Introduced self, role, reason for visit. Pt from [facility] where she has been since April. Confirmed other demographics- as she has been in SNF does not currently have community PCP. She has multiple adult children in [place] and other states. She has not gotten her COVID vaccine and declines to receive it at this time, states she prays Psalms [employer] and it has worked for her so far. Pt plan is to return to SNF when ready- we discussed attempting to get pt more therapy ordered for pt when she returns to SNF. Pt states understanding. Auth preliminarily started under ref #[id number].   Expected Discharge Plan: Skilled Nursing Facility Barriers to Discharge: Continued Medical Work up   Patient Goals and CMS Choice Patient states their goals for this hospitalization and ongoing recovery are:: get up and walk again CMS Medicare.gov Compare Post Acute Care list provided to:: Patient (resident at [facility]- plan for return to same facility) Choice offered to / list presented to : Patient  Expected Discharge Plan and Services Expected Discharge Plan: [place] In-house Referral: Clinical Social Work Discharge Planning Services: CM Consult Post Acute Care Choice: Resumption of Product/process development scientist, [place] Living arrangements for the past 2 months: [place]  Prior Living Arrangements/Services Living arrangements for the past 2 months: [place] Lives with:: Facility Resident Patient language and need for interpreter reviewed:: Yes (no needs)        Need for Family Participation in Patient Care: Yes (Comment) (assistance w/ daily cares) Care giver  support system in place?: Yes (comment) (facility staff) Current home services: DME Criminal Activity/Legal Involvement Pertinent to Current Situation/Hospitalization: No - Comment as needed  Activities of Daily Living Home Assistive Devices/Equipment: Civil Service fast streamer ADL Screening (condition at time of admission) Patient's cognitive ability adequate to safely complete daily activities?: Yes Is the patient deaf or have difficulty hearing?: No Does the patient have difficulty seeing, even when wearing glasses/contacts?: No Does the patient have difficulty concentrating, remembering, or making decisions?: No Patient able to express need for assistance with ADLs?: Yes Does the patient have difficulty dressing or bathing?: Yes Independently performs ADLs?: No Communication: Independent Dressing (OT): Needs assistance Is this a change from baseline?: Pre-admission baseline Grooming: Needs assistance Is this a change from baseline?: Pre-admission baseline Feeding: Independent with device (comment) Bathing: Needs assistance Is this a change from baseline?: Pre-admission baseline Toileting: Dependent Is this a change from baseline?: Pre-admission baseline In/Out Bed: Dependent Is this a change from baseline?: Pre-admission baseline Walks in Home: Dependent Is this a change from baseline?: Pre-admission baseline Does the patient have difficulty walking or climbing stairs?: Yes Weakness of Legs: Both Weakness of Arms/Hands: Both  Permission Sought/Granted Permission sought to share information with : Family Supports, Chartered certified accountant granted to share information with : Yes, Verbal Permission Granted  Share Information with NAME: Evette Revels  Permission granted to share info w AGENCY: [facility]  Permission granted to share info w Relationship: daughter  Permission granted to share info w Contact Information: [phone]  Emotional Assessment Appearance::  Appears stated age Attitude/Demeanor/Rapport: Engaged, Gracious Affect (typically observed):  Appropriate Orientation: : Oriented to Self, Oriented to  Time, Oriented to Place, Oriented to Situation Alcohol / Substance Use: Not Applicable Psych Involvement: No (comment)  Admission diagnosis:  Chest pain [R07.9] Patient Active Problem List   Diagnosis Date Noted  . Chest pain 04/05/2020  . Heart murmur 04/05/2020  . Pulmonary nodule 04/05/2020  . Essential hypertension 04/05/2020  . Rheumatoid arthritis ([place]) 03/16/2011   PCP:  Patient, No Pcp Per Pharmacy:   [place], [place] - [address] [place] [address] Phone: [phone] Fax: [phone]  Readmission Risk Interventions No flowsheet data found.

## 2020-04-06 ENCOUNTER — Observation Stay (HOSPITAL_BASED_OUTPATIENT_CLINIC_OR_DEPARTMENT_OTHER): Payer: Medicare Other

## 2020-04-06 DIAGNOSIS — R011 Cardiac murmur, unspecified: Secondary | ICD-10-CM | POA: Diagnosis not present

## 2020-04-06 DIAGNOSIS — R079 Chest pain, unspecified: Secondary | ICD-10-CM | POA: Diagnosis not present

## 2020-04-06 DIAGNOSIS — R911 Solitary pulmonary nodule: Secondary | ICD-10-CM | POA: Diagnosis not present

## 2020-04-06 DIAGNOSIS — I10 Essential (primary) hypertension: Secondary | ICD-10-CM | POA: Diagnosis not present

## 2020-04-06 MED ORDER — TECHNETIUM TC 99M TETROFOSMIN IV KIT
31.2000 | PACK | Freq: Once | INTRAVENOUS | Status: AC | PRN
  Administered 2020-04-06: 31.2 via INTRAVENOUS

## 2020-04-06 MED ORDER — TECHNETIUM TC 99M TETROFOSMIN IV KIT
10.8000 | PACK | Freq: Once | INTRAVENOUS | Status: AC | PRN
  Administered 2020-04-06: 10.8 via INTRAVENOUS

## 2020-04-06 MED ORDER — REGADENOSON 0.4 MG/5ML IV SOLN
0.4000 mg | Freq: Once | INTRAVENOUS | Status: AC
  Filled 2020-04-06: qty 5

## 2020-04-06 MED ORDER — AMINOPHYLLINE 25 MG/ML IV SOLN
INTRAVENOUS | Status: AC
  Filled 2020-04-06: qty 10

## 2020-04-06 MED ORDER — REGADENOSON 0.4 MG/5ML IV SOLN
INTRAVENOUS | Status: AC
  Administered 2020-04-06: 0.4 mg via INTRAVENOUS
  Filled 2020-04-06: qty 5

## 2020-04-06 NOTE — TOC Progression Note (Signed)
Transition of Care [facility]) - Progression Note    Patient Details  Name: Donna Everett MRN: [id number] Date of Birth: [birth year]  [facility] CM[facility]SW Contact  Doy Hutching, [place] Phone Number: 04/06/2020, 9:45 AM  Clinical Narrative:    Berkley Harvey received for pt to return to [facility] w/ therapies. Approved under ref #[id number], 9/21-9/23   Expected Discharge Plan: Skilled Nursing Facility Barriers to Discharge: Continued Medical Work up  Expected Discharge Plan and Services Expected Discharge Plan: Skilled Nursing Facility In-house Referral: Clinical Social Work Discharge Planning Services: CM Consult Post Acute Care Choice: Resumption of Svcs/PTA Provider, Skilled Nursing Facility Living arrangements for the past 2 months: Skilled Nursing Facility  Readmission Risk Interventions No flowsheet data found.

## 2020-04-06 NOTE — Progress Notes (Addendum)
   Donna Everett presented for a nuclear stress test today.  No immediate complications.  Stress imaging is pending at this time.  Preliminary EKG findings may be listed in the chart, but the stress test result will not be finalized until perfusion imaging is complete.  Pt developed CP at 5/10 w/ Lexiscan, HR to 121, sustained in the 110s. Sx were prolonged but not severe. Unable to give aminophylline because pt extremely intolerant to caffeine (a breakdown product of the Lexiscan). Continue to monitor.  1 day study, [facility] to read.  Theodore Demark, PA-C 04/06/2020, 12:49 PM

## 2020-04-06 NOTE — Progress Notes (Signed)
PROGRESS NOTE    Ronnell Umphenour  DYJ:[id number] DOB: [birth year] DOA: 04/04/2020 PCP: Patient, No Pcp Per    Brief Narrative:  Sheneika Merlin is an 85 year old female with past medical history notable for rheumatoid arthritis, essential hypertension who presented to the ED via EMS from [facility]-term [facility] facility with complaints of chest pressure.  Notes substernal chest pressure without radiation that is dull in nature with associated shortness of breath, nausea.  Pain has been persistent since onset.  Reports stress test many years ago but no history of cardiac catheterization.  Patient currently resident of long-term care at [facility] since April 2021 for generalized weakness and difficulty caring for herself at home.  In the ED, BP 150/78, HR 95, RR 19, SPO2 99% on room air.  Sodium 137, potassium 3.8, BUN 22, creatinine 0.58, glucose 127.  WBC count 4.5, hemoglobin 12.1, platelets 166.  Covid-19 PCR negative.  Troponin VI, 6, within normal limits.  D-dimer elevated 3.31.  CT angiogram chest negative for PE but with multiple pulmonary nodules, largest 9 mm cavitary nodule left apex consistent with postinfectious versus inflammatory versus cavitary neoplasm.  TRH consulted for further evaluation and management of chest pain.   Assessment & Plan:   Principal Problem:   Chest pain Active Problems:   Rheumatoid arthritis (HCC)   Heart murmur   Pulmonary nodule   Essential hypertension   Atypical chest pain Patient presenting to the ED via EMS following acute onset chest pressure without radiation that has been persistent.  EKG with left bundle branch block which is old and no ST elevation/depression or concerning T wave inversion.  Troponin have been negative.  EKG unrevealing.  Lipid panel with total cholesterol 151, HDL 59, LDL 80, triglycerides 61.  TTE with LVEF 60-65%, no LV regional wall motion abnormalities, severe thickening aortic valve with no evidence of  aortic stenosis. --Nuclear med stress test: Pending --Continue aspirin 81 mg p.o. daily --Continue monitor on telemetry  Pulmonary nodules CT angiogram chest notable for multiple pulmonary nodules, largest 9 mm cavitary left apex consistent with postinfectious versus inflammatory versus cavitary neoplasm.  Patient well aware of her pulmonary nodules, history of sarcoidosis has been followed in the distant past by her PCP.  Radiology recommending interval follow-up 5-month CT chest versus PET CT versus tissue sample.  But unable to perform percutaneous sample due to lack of soft tissue per their report. --Will place ambulatory referral to pulmonology for further evaluation/surveillance following discharge  Essential hypertension --Continue HCTZ 12.5 mg p.o. daily  Generalized weakness, debility: Patient has been a resident of [facility] since April 2021 for generalized weakness, debility and unable to care for self.  She states therapy has not worked with her over the past 1-2 months; and she would like to get more ambulatory. --PT/OT recommending SNF, plan to return back to [facility]  Moderate protein calorie malnutrition Body mass index is 19.22 kg/m. --Nutrition consult placed   DVT prophylaxis: SCDs, Lovenox Code Status: Full code Family Communication: Updated patient extensively at bedside  Disposition Plan:  Status is: Observation  The patient remains OBS appropriate and will d/c before 2 midnights.  Plan to discharge back to [facility] on 04/07/2020 as long as nuclear medicine stress test negative.  Dispo: The patient is from: SNF              Anticipated d/c is to: SNF              Anticipated d/c date is:  1 day              Patient currently is not medically stable to d/c.   Consultants:   None  Procedures:   Transthoracic echocardiogram  Nuclear medicine stress test: Pending  Antimicrobials:   None   Subjective: Patient seen and  examined at bedside, resting comfortably.  Denies any current chest pain at this time.  Awaiting nuclear medicine stress test later this morning.  No other complaints or concerns at this time.  Denies headache, no dizziness, no chest pain, no shortness of breath, no palpitations, no abdominal pain.  No acute events overnight per nursing staff.  Objective: Vitals:   04/05/20 1146 04/05/20 1828 04/06/20 0104 04/06/20 0544  BP: (!) 148/67 132/60 119/75 125/76  Pulse: 89 89 92 87  Resp: Temp: (!) 97.5 F (36.4 C) 97.6 F (36.4 C) 98.6 F (37 C) 98 F (36.7 C)  TempSrc: Oral Oral Oral Oral  SpO2: 94% 98% 100% 99%  Weight:      Height:        Intake/Output Summary (Last 24 hours) at 04/06/2020 1120 Last data filed at 04/06/2020 1050 Gross per 24 hour  Intake 240 ml  Output 950 ml  Net -710 ml   Filed Weights   04/04/20 1607 04/05/20 0242  Weight: 52.6 kg 52.4 kg    Examination:  General exam: Appears calm and comfortable, thin in appearance Respiratory system: Clear to auscultation. Respiratory effort normal.  Oxygenating well on room air Cardiovascular system: S1 & S2 heard, RRR. No JVD, murmurs, rubs, gallops or clicks. No pedal edema. Gastrointestinal system: Abdomen is nondistended, soft and nontender. No organomegaly or masses felt. Normal bowel sounds heard. Central nervous system: Alert and oriented. No focal neurological deficits. Extremities: Symmetric 5 x 5 power. Skin: No rashes, lesions or ulcers Psychiatry: Judgement and insight appear normal. Mood & affect appropriate.     Data Reviewed: I have personally reviewed following labs and imaging studies  CBC: Recent Labs  Lab 04/04/20 1240 04/05/20 0246  WBC 5.2 4.5  HGB 12.3 12.1  HCT 39.8 37.7  MCV 92.1 92.6  PLT 287 196   Basic Metabolic Panel: Recent Labs  Lab 04/04/20 1240 04/05/20 0246  NA 137  --   K 3.8  --   CL 100  --   CO2 26  --   GLUCOSE 127*  --   BUN 22  --   CREATININE  0.58 0.64  CALCIUM 10.3  --    GFR: Estimated Creatinine Clearance: 44.1 mL/min (by C-G formula based on SCr of 0.64 mg/dL). Liver Function Tests: No results for input(s): AST, ALT, ALKPHOS, BILITOT, PROT, ALBUMIN in the last 168 hours. No results for input(s): LIPASE, AMYLASE in the last 168 hours. No results for input(s): AMMONIA in the last 168 hours. Coagulation Profile: No results for input(s): INR, PROTIME in the last 168 hours. Cardiac Enzymes: No results for input(s): CKTOTAL, CKMB, CKMBINDEX, TROPONINI in the last 168 hours. BNP (last 3 results) No results for input(s): PROBNP in the last 8760 hours. HbA1C: No results for input(s): HGBA1C in the last 72 hours. CBG: No results for input(s): GLUCAP in the last 168 hours. Lipid Profile: Recent Labs    04/05/20 0246  CHOL 151  HDL 59  LDLCALC 80  TRIG 61  CHOLHDL 2.6   Thyroid Function Tests: No results for input(s): TSH, T4TOTAL, FREET4, T3FREE, THYROIDAB in the last 72 hours. Anemia Panel: No results  for input(s): VITAMINB12, FOLATE, FERRITIN, TIBC, IRON, RETICCTPCT in the last 72 hours. Sepsis Labs: No results for input(s): PROCALCITON, LATICACIDVEN in the last 168 hours.  Recent Results (from the past 240 hour(s))  SARS Coronavirus 2 by RT PCR (hospital order, performed in [facility] hospital lab) Nasopharyngeal Nasopharyngeal Swab     Status: None   Collection Time: 04/05/20 12:05 AM   Specimen: Nasopharyngeal Swab  Result Value Ref Range Status   SARS Coronavirus 2 NEGATIVE NEGATIVE Final    Comment: (NOTE) SARS-CoV-2 target nucleic acids are NOT DETECTED.  The SARS-CoV-2 RNA is generally detectable in upper and lower respiratory specimens during the acute phase of infection. The lowest concentration of SARS-CoV-2 viral copies this assay can detect is 250 copies / mL. A negative result does not preclude SARS-CoV-2 infection and should not be used as the sole basis for treatment or other patient management  decisions.  A negative result may occur with improper specimen collection / handling, submission of specimen other than nasopharyngeal swab, presence of viral mutation(s) within the areas targeted by this assay, and inadequate number of viral copies (<250 copies / mL). A negative result must be combined with clinical observations, patient history, and epidemiological information.  Fact Sheet for Patients:   BoilerBrush.com.cy  Fact Sheet for Healthcare Providers: https://pope.com/  This test is not yet approved or  cleared by the [place] FDA and has been authorized for detection and/or diagnosis of SARS-CoV-2 by FDA under an Emergency Use Authorization (EUA).  This EUA will remain in effect (meaning this test can be used) for the duration of the COVID-19 declaration under Section 564(b)(1) of the Act, 21 U.S.C. section 360bbb-3(b)(1), unless the authorization is terminated or revoked sooner.  Performed at [facility], [address] [place], [place] [address]          Radiology Studies: DG Chest 2 View  Result Date: 04/04/2020 CLINICAL DATA:  Increased heart rate this morning. Intermittent left-sided chest and axillary pain. No shortness of breath. EXAM: CHEST - 2 VIEW COMPARISON:  None. FINDINGS: The heart, hila, and mediastinum are normal. No pneumothorax. No nodules or masses. Increased interstitial opacities in the lungs are age indeterminate. No focal infiltrate. IMPRESSION: Increased interstitial opacities in the lungs are age indeterminate. Atypical infection could have this appearance. Recommend clinical correlation and attention on short-term follow-up. Electronically Signed   By: Gerome Sam III M.D   On: 04/04/2020 13:02   CT Angio Chest PE W and/or Wo Contrast  Result Date: 04/04/2020 CLINICAL DATA:  Tachycardia, chest pain, elevated D-dimer EXAM: CT ANGIOGRAPHY CHEST WITH CONTRAST TECHNIQUE:  Multidetector CT imaging of the chest was performed using the standard protocol during bolus administration of intravenous contrast. Multiplanar CT image reconstructions and MIPs were obtained to evaluate the vascular anatomy. CONTRAST:  64mL OMNIPAQUE IOHEXOL 350 MG/ML SOLN COMPARISON:  None. FINDINGS: Cardiovascular: There is excellent opacification of the pulmonary arterial tree. No intraluminal filling defect identified to suggest acute pulmonary embolism. The central pulmonary arteries are markedly enlarged in keeping with changes of pulmonary arterial hypertension. There is marked left ventricular hypertrophy with resultant mild global cardiomegaly. No significant coronary artery calcification. No pericardial effusion. The thoracic aorta is of normal caliber. Mild atherosclerotic calcification noted within its descending segment. 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 Arch vasculature is widely patent proximally. Mediastinum/Nodes: No pathologic thoracic adenopathy. Lungs/Pleura: There is bronchiectasis and  architectural distortion involving the lingula and left lower lobe with air trapping within the left lower lobe likely related to small airways disease. The focality of these changes suggest a post infectious etiology. Bilateral upper lobe bronchiectasis is also seen time a left severe. No associated nodularity or focal pulmonary infiltrate. A cavitary nodule is seen within the left apex, 9 mm, axial image # 22/6, demonstrating a thin uniform rind, nonspecific. While this may represent a a post infectious or post inflammatory cavity, a cavitary neoplasm is difficult to exclude. 3 mm noncalcified indeterminate pulmonary nodule, right upper lobe, axial image # 69/6. Upper Abdomen: No acute abnormality. Musculoskeletal: No acute bone abnormality Review of the MIP images confirms the above findings. IMPRESSION: No  pulmonary embolism. Marked left ventricular hypertrophy. Marked pulmonary arterial enlargement suggesting changes of pulmonary artery hypertension, possibly related to diastolic dysfunction. Multifocal areas of bronchiectasis, most severe within the left lower lobe, likely post infectious in etiology. No superimposed active pulmonary infiltrate identified. Multiple pulmonary nodules. 9 mm cavitary nodule within the left apex may be post infectious or inflammatory in nature, however, a cavitary neoplasm should be excluded. Consider one of the following in 3 months for both low-risk and high-risk individuals: (a) repeat chest CT, (b) follow-up PET-CT, or (c) tissue sampling. Note that attempt at percutaneous tissue sampling may be unlikely to yield a diagnostic sample given the lack of significant associated soft tissue. This recommendation follows the consensus statement: Guidelines for Management of Incidental Pulmonary Nodules Detected on CT Images: From the Fleischner Society 2017; Radiology 2017; 284:228-243. Aortic Atherosclerosis (ICD10-I70.0). Electronically Signed   By: Helyn Numbers MD   On: 04/04/2020 23:25   ECHOCARDIOGRAM COMPLETE  Result Date: 04/05/2020    ECHOCARDIOGRAM REPORT   Patient Name:   CENDY OCONNOR Date of Exam: 04/05/2020 Medical Rec #:  [id number]         Height:       65.0 in Accession #:    [id number]        Weight:       115.5 lb Date of Birth:  1934/10/12         BSA:          1.567 m Patient Age:    85 years          BP:           146/76 mmHg Patient Gender: F                 HR:           93 bpm. Exam Location:  Inpatient Procedure: 2D Echo, Cardiac Doppler and Color Doppler Indications:    Murmur 785.2 / R01.1  History:        Patient has no prior history of Echocardiogram examinations.                 Signs/Symptoms:Murmur and Chest Pain; Risk Factors:Hypertension                 and Non-Smoker.  Sonographer:    Renella Cunas RDCS Referring Phys: [id number] BRADLEY S CHOTINER  IMPRESSIONS  1. Left ventricular ejection fraction, by estimation, is 60 to 65%. The left ventricle has normal function. The left ventricle has no regional wall motion abnormalities. Indeterminate diastolic filling due to E-A fusion.  2. Right ventricular systolic function is normal. The right ventricular size is normal. Tricuspid regurgitation signal is inadequate for assessing PA pressure.  3. The mitral valve is normal in structure. No  evidence of mitral valve regurgitation. No evidence of mitral stenosis. Moderate mitral annular calcification.  4. The aortic valve is tricuspid. There is severe calcifcation of the aortic valve. There is severe thickening of the aortic valve. Aortic valve regurgitation is not visualized. Moderate aortic valve sclerosis/calcification is present, without any evidence of aortic stenosis.  5. The inferior vena cava is normal in size with greater than an 50% respiratory variability, suggesting right atrial pressure of 3 mmHg.  6. The AV is poorly seen. No obvious AS but recommend repeat limited echo to focus on AV and doppler assessment. FINDINGS  Left Ventricle: Left ventricular ejection fraction, by estimation, is 60 to 65%. The left ventricle has normal function. The left ventricle has no regional wall motion abnormalities. The left ventricular internal cavity size was normal in size. There is  no left ventricular hypertrophy. Indeterminate diastolic filling due to E-A fusion. Right Ventricle: The right ventricular size is normal. No increase in right ventricular wall thickness. Right ventricular systolic function is normal. Tricuspid regurgitation signal is inadequate for assessing PA pressure. Left Atrium: Left atrial size was normal in size. Right Atrium: Right atrial size was normal in size. Pericardium: There is no evidence of pericardial effusion. Mitral Valve: The mitral valve is normal in structure. Moderate mitral annular calcification. No evidence of mitral valve  regurgitation. No evidence of mitral valve stenosis. Tricuspid Valve: The tricuspid valve is normal in structure. Tricuspid valve regurgitation is trivial. No evidence of tricuspid stenosis. Aortic Valve: The aortic valve is tricuspid. There is severe calcifcation of the aortic valve. There is severe thickening of the aortic valve. Aortic valve regurgitation is not visualized. Moderate aortic valve sclerosis/calcification is present, without  any evidence of aortic stenosis. Pulmonic Valve: The pulmonic valve was normal in structure. Pulmonic valve regurgitation is not visualized. No evidence of pulmonic stenosis. Aorta: The aortic root is normal in size and structure. Venous: The inferior vena cava is normal in size with greater than 50% respiratory variability, suggesting right atrial pressure of 3 mmHg. IAS/Shunts: No atrial level shunt detected by color flow Doppler.  LEFT VENTRICLE PLAX 2D LVIDd:         4.20 cm     Diastology LVIDs:         2.80 cm     LV e' medial:    4.90 cm/s LV PW:         0.70 cm     LV E/e' medial:  16.5 LV IVS:        0.70 cm     LV e' lateral:   6.53 cm/s LVOT diam:     2.10 cm     LV E/e' lateral: 12.4 LV SV:         57 LV SV Index:   36 LVOT Area:     3.46 cm  LV Volumes (MOD) LV vol d, MOD A2C: 89.8 ml LV vol d, MOD A4C: 53.0 ml LV vol s, MOD A2C: 41.9 ml LV vol s, MOD A4C: 31.0 ml LV SV MOD A2C:     47.9 ml LV SV MOD A4C:     53.0 ml LV SV MOD BP:      34.6 ml RIGHT VENTRICLE RV S prime:     14.90 cm/s TAPSE (M-mode): 2.1 cm LEFT ATRIUM           Index       RIGHT ATRIUM          Index LA diam:  3.00 cm 1.92 cm/m  RA Area:     8.31 cm LA Vol (A2C): 28.1 ml 17.94 ml/m RA Volume:   15.70 ml 10.02 ml/m LA Vol (A4C): 14.4 ml 9.19 ml/m  AORTIC VALVE LVOT Vmax:   96.70 cm/s LVOT Vmean:  60.800 cm/s LVOT VTI:    0.165 m  AORTA Ao Root diam: 2.90 cm MITRAL VALVE MV Area (PHT): 5.31 cm     SHUNTS MV Decel Time: 143 msec     Systemic VTI:  0.16 m MV E velocity: 81.00 cm/s    Systemic Diam: 2.10 cm MV A velocity: 135.00 cm/s MV E/A ratio:  0.60 Armanda Magic MD Electronically signed by Armanda Magic MD Signature Date/Time: 04/05/2020/11:29:46 AM    Final         Scheduled Meds: . aspirin EC  81 mg Oral q AM  . enoxaparin (LOVENOX) injection  40 mg Subcutaneous Q24H  . hydrochlorothiazide  12.5 mg Oral q AM   Continuous Infusions:   LOS: 0 days    Time spent: 36 minutes spent on chart review, discussion with nursing staff, consultants, updating family and interview/physical exam; more than 50% of that time was spent in counseling and/or coordination of care.    Alvira Philips [place], DO [facility] Available via Epic secure chat 7am-7pm After these hours, please refer to coverage provider listed on [email or web address] 04/06/2020, 11:20 AM

## 2020-04-06 NOTE — Evaluation (Signed)
Occupational Therapy Evaluation Patient Details Name: Donna Everett MRN: [id number] DOB: 05/16/1935 Today's Date: 04/06/2020    History of Present Illness Pt is an 85 y/o female admitted from SNF secondary to chest pain. Workup pending. PMH includes RA and HTN.    Clinical Impression   PTA patient reports needing assist for most ADLs (but able to grooming/feed self) and requires assist for mobility, using WC when OOB. Admitted for above and limited by problem list below, including decreased strength and functional ROM of BUEs, impaired balance, generalized weakness and decreased activity tolerance. Patient currently demonstrates ability to complete bed mobility with supervision, ADLs with setup to total assist. Will monitor for splinting of UEs. Believe she will benefit from further OT services while admitted to optimize increased functional use of UEs, strengthening, activity tolerance to optimize independence with ADLs and functional mobility. Will follow acutely.      Follow Up Recommendations  SNF;Supervision/Assistance - 24 hour    Equipment Recommendations  None recommended by OT    Recommendations for Other Services       Precautions / Restrictions Precautions Precautions: Fall Restrictions Weight Bearing Restrictions: No      Mobility Bed Mobility Overal bed mobility: Needs Assistance Bed Mobility: Supine to Sit;Sit to Supine     Supine to sit: Supervision Sit to supine: Supervision   General bed mobility comments: Supervision for safety, increased time and use of rails with [facility] elevated   Transfers                 General transfer comment: deferred    Balance Overall balance assessment: Needs assistance Sitting-balance support: No upper extremity supported;Feet supported Sitting balance-Leahy Scale: Fair                                     ADL either performed or assessed with clinical judgement   ADL Overall ADL's : Needs  assistance/impaired Eating/Feeding: NPO   Grooming: Wash/dry face;Set up Grooming Details (indicate cue type and reason): able to manage toothpaste as well  Upper Body Bathing: Moderate assistance;Sitting   Lower Body Bathing: Maximal assistance;Sitting/lateral leans;Bed level   Upper Body Dressing : Moderate assistance;Sitting   Lower Body Dressing: Maximal assistance;Sitting/lateral leans;Bed level     Toilet Transfer Details (indicate cue type and reason): deferred          Functional mobility during ADLs: Supervision/safety (limited to EOB ) General ADL Comments: pt limited by weakness, BUE ROM and strength limitations     Vision         Perception     Praxis      Pertinent Vitals/Pain Pain Assessment: Faces Faces Pain Scale: Hurts even more Pain Location: L knee Pain Descriptors / Indicators: Grimacing;Guarding Pain Intervention(s): Limited activity within patient's tolerance;Monitored during session;Repositioned     Hand Dominance Right   Extremity/Trunk Assessment Upper Extremity Assessment Upper Extremity Assessment: RUE deficits/detail;LUE deficits/detail RUE Deficits / Details: arthritic changes to hand with ulnar drift of MCPs and decreased hand extension, grossly 60% extension with flexion MMT 3-/5, elbow extension limited -20 degrees, shoulder flexion approx 25* AROM (PROM 45*) --tightness at end range, contractures?  RUE Coordination: decreased fine motor;decreased gross motor LUE Deficits / Details: arthritic changes to hand with ulnar drift of MCPs and decreased hand extension, grossly 75% extension with flexion MMT 3-/5, elbow extension limited -20 degrees, shoulder flexion approx 45* AROM (PROM 60*)- tightness at end range,  contractures?  LUE Coordination: decreased fine motor;decreased gross motor   Lower Extremity Assessment Lower Extremity Assessment: Defer to PT evaluation   Cervical / Trunk Assessment Cervical / Trunk Assessment: Kyphotic    Communication Communication Communication: No difficulties   Cognition Arousal/Alertness: Awake/alert Behavior During Therapy: WFL for tasks assessed/performed Overall Cognitive Status: Within Functional Limits for tasks assessed                                     General Comments  discussed possible splinting to UEs, pt agreeable will continue to assess; encouraged hand/eblow extension at rest, pt voiced understanding     Exercises     Shoulder Instructions      Home Living Family/patient expects to be discharged to:: Skilled nursing facility                                 Additional Comments: PT/OT 2 months ago      Prior Functioning/Environment Level of Independence: Needs assistance  Gait / Transfers Assistance Needed: Used WC when OOB  ADL's / Homemaking Assistance Needed: Needs assist with ADLs. Able to groom and feed self.             OT Problem List: Decreased strength;Decreased activity tolerance;Decreased range of motion;Impaired balance (sitting and/or standing);Decreased coordination;Decreased knowledge of precautions;Pain;Impaired UE functional use      OT Treatment/Interventions: Self-care/ADL training;Therapeutic exercise;DME and/or AE instruction;Therapeutic activities;Patient/family education;Balance training;Splinting;Manual therapy    OT Goals(Current goals can be found in the care plan section) Acute Rehab OT Goals Patient Stated Goal: to move better  OT Goal Formulation: With patient Time For Goal Achievement: 04/20/20 Potential to Achieve Goals: Fair  OT Frequency: Min 2X/week   Barriers to D/C:            Co-evaluation              AM-PAC OT "6 Clicks" Daily Activity     Outcome Measure Help from another person eating meals?: A Little Help from another person taking care of personal grooming?: A Little Help from another person toileting, which includes using toliet, bedpan, or urinal?: Total Help  from another person bathing (including washing, rinsing, drying)?: A Lot Help from another person to put on and taking off regular upper body clothing?: A Lot Help from another person to put on and taking off regular lower body clothing?: A Lot 6 Click Score: 13   End of Session Nurse Communication: Mobility status  Activity Tolerance: Patient tolerated treatment well Patient left: in bed;with call bell/phone within reach;with bed alarm set  OT Visit Diagnosis: Other abnormalities of gait and mobility (R26.89);Muscle weakness (generalized) (M62.81);Pain Pain - Right/Left: Left Pain - part of body: Knee                Time: 0920-0949 OT Time Calculation (min): 29 min Charges:  OT General Charges $OT Visit: 1 Visit OT Evaluation $OT Eval Moderate Complexity: 1 Mod OT Treatments $Self Care/Home Management : 8-22 mins  Barry Brunner, OT Acute Rehabilitation Services Pager [phone] Office [phone]   Chancy Milroy 04/06/2020, 11:41 AM

## 2020-04-07 DIAGNOSIS — R079 Chest pain, unspecified: Secondary | ICD-10-CM | POA: Diagnosis not present

## 2020-04-07 LAB — SARS CORONAVIRUS 2 BY RT PCR (HOSPITAL ORDER, PERFORMED IN ~~LOC~~ HOSPITAL LAB): SARS Coronavirus 2: NEGATIVE

## 2020-04-07 MED ORDER — MAGNESIUM HYDROXIDE 400 MG/5ML PO SUSP: 30.0000 mL | Freq: Every day | ORAL | Status: DC | PRN

## 2020-04-07 MED ORDER — ENSURE ENLIVE PO LIQD
237.0000 mL | Freq: Two times a day (BID) | ORAL | Status: DC
  Administered 2020-04-07 – 2020-04-08 (×4): 237 mL via ORAL

## 2020-04-07 NOTE — Progress Notes (Signed)
Initial Nutrition Assessment  DOCUMENTATION CODES:   Not applicable  INTERVENTION:  Ensure Enlive po BID, each supplement provides 350 kcal and 20 grams of protein   NUTRITION DIAGNOSIS:   Predicted suboptimal nutrient intake related to decreased appetite as evidenced by other (comment) (consult to assess nutrition status).    GOAL:   Patient will meet greater than or equal to 90% of their needs    MONITOR:   PO intake, Supplement acceptance, Labs, Weight trends, I & O's  REASON FOR ASSESSMENT:   Consult Assessment of nutrition requirement/status  ASSESSMENT:   Pt from SNF presents with chest pain. PMH includes RA and HTN.   RD working remotely. Unable to reach pt via phone. Per MD, pt's stress imaging is pending at this time. If negative, pt will d/c back to SNF.   PO Intake: 100% x 1 recorded meal  Labs and medications reviewed.  NUTRITION - FOCUSED PHYSICAL EXAM:  Unable to perform at this time; will attempt at follow-up.   Diet Order:   Diet Order            Diet regular Room service appropriate? Yes; Fluid consistency: Thin  Diet effective now                 EDUCATION NEEDS:   No education needs have been identified at this time  Skin:  Skin Assessment: Reviewed RN Assessment  Last BM:  PTA  Height:   Ht Readings from Last 1 Encounters:  04/04/20 5\' 5"  (1.651 m)    Weight:   Wt Readings from Last 1 Encounters:  04/05/20 52.4 kg    BMI:  Body mass index is 19.22 kg/m.  Estimated Nutritional Needs:   Kcal:  1300-1500  Protein:  65-75 grams  Fluid:  >1.3L/d    04/07/20, MS, RD, LDN RD pager number and weekend/on-call pager number located in Amion.

## 2020-04-07 NOTE — Progress Notes (Addendum)
PROGRESS NOTE    Donna Everett  IRS:[id number] DOB: 04-22-1935 DOA: 04/04/2020 PCP: Patient, No Pcp Per    Brief Narrative:  Donna Everett is an 85 year old female with past medical history notable for rheumatoid arthritis, essential hypertension who presented to the ED via EMS from [facility] long-term [facility] facility with complaints of chest pressure.  Notes substernal chest pressure without radiation that is dull in nature with associated shortness of breath, nausea.  Pain has been persistent since onset.  Reports stress test many years ago but no history of cardiac catheterization.  Patient currently resident of long-term care at [facility] since April 2021 for generalized weakness and difficulty caring for herself at home.    Assessment & Plan:   Principal Problem:   Chest pain Active Problems:   Rheumatoid arthritis (HCC)   Heart murmur   Pulmonary nodule   Essential hypertension   Atypical chest pain Patient presenting to the ED via EMS following acute onset chest pressure without radiation that has been persistent.  EKG with left bundle branch block which is old and no ST elevation/depression or concerning T wave inversion.  Troponin have been negative.  EKG unrevealing.  Lipid panel with total cholesterol 151, HDL 59, LDL 80, triglycerides 61.  TTE with LVEF 60-65%, no LV regional wall motion abnormalities, severe thickening aortic valve with no evidence of aortic stenosis. --Nuclear med stress test: Pending results (apparantly machine to read is not working so awaiting a Pensions consultant to fix) --Continue aspirin 81 mg p.o. daily --Continue monitor on telemetry  Pulmonary nodules CT angiogram chest notable for multiple pulmonary nodules, largest 9 mm cavitary left apex consistent with postinfectious versus inflammatory versus cavitary neoplasm.  Patient well aware of her pulmonary nodules, history of sarcoidosis has been followed in the distant past by her PCP.   Radiology recommending interval follow-up 23-month CT chest versus PET CT versus tissue sample.  But unable to perform percutaneous sample due to lack of soft tissue per their report. --ambulatory referral to pulmonology for further evaluation/surveillance following discharge  Essential hypertension --Continue HCTZ 12.5 mg p.o. daily  Generalized weakness, debility: Patient has been a resident of [facility] since April 2021 for generalized weakness, debility and unable to care for self.  She states therapy has not worked with her over the past 1-2 months; and she would like to get more ambulatory. --PT/OT recommending SNF, plan to return back to [facility]  Moderate protein calorie malnutrition Body mass index is 19.22 kg/m. --Nutrition consult placed   DVT prophylaxis: SCDs, Lovenox Code Status: Full code  Disposition Plan:  Status is: Observation  The patient remains OBS appropriate and will d/c before 2 midnights.  Plan to discharge back to [facility] once stress test is back/machine fixed  Dispo: The patient is from: SNF              Anticipated d/c is to: SNF              Anticipated d/c date is: 1 day              Patient currently is not medically stable to d/c.   Consultants:   None  Procedures:   Transthoracic echocardiogram  Nuclear medicine stress test: Pending     Subjective: C/o right ear pain due to draft  Objective: Vitals:   04/06/20 1249 04/06/20 1744 04/07/20 0600 04/07/20 0820  BP: (!) 147/70 (!) 109/57 (!) 149/72 (!) 144/76  Pulse:  82 83 94  Resp:  17 16   Temp:  98.4 F (36.9 C) 98.4 F (36.9 C)   TempSrc:  Oral Oral   SpO2:  100% 98%   Weight:      Height:        Intake/Output Summary (Last 24 hours) at 04/07/2020 1230 Last data filed at 04/07/2020 0819 Gross per 24 hour  Intake 590 ml  Output --  Net 590 ml   Filed Weights   04/04/20 1607 04/05/20 0242  Weight: 52.6 kg 52.4 kg     Examination:   General: Appearance:    Thin female in no acute distress     Lungs:     Clear to auscultation bilaterally, respirations unlabored  Heart:    Normal heart rate. Normal rhythm.    MS:   All extremities are intact.   Neurologic:   Awake, alert, oriented x 3. No apparent focal neurological           defect.  Pleasant and cooperative    Data Reviewed: I have personally reviewed following labs and imaging studies  CBC: Recent Labs  Lab 04/04/20 1240 04/05/20 0246  WBC 5.2 4.5  HGB 12.3 12.1  HCT 39.8 37.7  MCV 92.1 92.6  PLT 287 196   Basic Metabolic Panel: Recent Labs  Lab 04/04/20 1240 04/05/20 0246  NA 137  --   K 3.8  --   CL 100  --   CO2 26  --   GLUCOSE 127*  --   BUN 22  --   CREATININE 0.58 0.64  CALCIUM 10.3  --    GFR: Estimated Creatinine Clearance: 44.1 mL/min (by C-G formula based on SCr of 0.64 mg/dL). Liver Function Tests: No results for input(s): AST, ALT, ALKPHOS, BILITOT, PROT, ALBUMIN in the last 168 hours. No results for input(s): LIPASE, AMYLASE in the last 168 hours. No results for input(s): AMMONIA in the last 168 hours. Coagulation Profile: No results for input(s): INR, PROTIME in the last 168 hours. Cardiac Enzymes: No results for input(s): CKTOTAL, CKMB, CKMBINDEX, TROPONINI in the last 168 hours. BNP (last 3 results) No results for input(s): PROBNP in the last 8760 hours. HbA1C: No results for input(s): HGBA1C in the last 72 hours. CBG: No results for input(s): GLUCAP in the last 168 hours. Lipid Profile: Recent Labs    04/05/20 0246  CHOL 151  HDL 59  LDLCALC 80  TRIG 61  CHOLHDL 2.6   Thyroid Function Tests: No results for input(s): TSH, T4TOTAL, FREET4, T3FREE, THYROIDAB in the last 72 hours. Anemia Panel: No results for input(s): VITAMINB12, FOLATE, FERRITIN, TIBC, IRON, RETICCTPCT in the last 72 hours. Sepsis Labs: No results for input(s): PROCALCITON, LATICACIDVEN in the last 168 hours.  Recent  Results (from the past 240 hour(s))  SARS Coronavirus 2 by RT PCR (hospital order, performed in [facility] hospital lab) Nasopharyngeal Nasopharyngeal Swab     Status: None   Collection Time: 04/05/20 12:05 AM   Specimen: Nasopharyngeal Swab  Result Value Ref Range Status   SARS Coronavirus 2 NEGATIVE NEGATIVE Final    Comment: (NOTE) SARS-CoV-2 target nucleic acids are NOT DETECTED.  The SARS-CoV-2 RNA is generally detectable in upper and lower respiratory specimens during the acute phase of infection. The lowest concentration of SARS-CoV-2 viral copies this assay can detect is 250 copies / mL. A negative result does not preclude SARS-CoV-2 infection and should not be used as the sole basis for treatment or other patient management decisions.  A negative result may  occur with improper specimen collection / handling, submission of specimen other than nasopharyngeal swab, presence of viral mutation(s) within the areas targeted by this assay, and inadequate number of viral copies (<250 copies / mL). A negative result must be combined with clinical observations, patient history, and epidemiological information.  Fact Sheet for Patients:   BoilerBrush.com.cy  Fact Sheet for Healthcare Providers: https://pope.com/  This test is not yet approved or  cleared by the [place] FDA and has been authorized for detection and/or diagnosis of SARS-CoV-2 by FDA under an Emergency Use Authorization (EUA).  This EUA will remain in effect (meaning this test can be used) for the duration of the COVID-19 declaration under Section 564(b)(1) of the Act, 21 U.S.C. section 360bbb-3(b)(1), unless the authorization is terminated or revoked sooner.  Performed at [facility], [address] [place], [place] [address]          Radiology Studies: No results found.      Scheduled Meds: . aspirin EC  81 mg Oral q AM  . enoxaparin  (LOVENOX) injection  40 mg Subcutaneous Q24H  . feeding supplement (ENSURE ENLIVE)  237 mL Oral BID BM  . hydrochlorothiazide  12.5 mg Oral q AM   Continuous Infusions:   LOS: 0 days       Joseph Art, DO [facility] Available via Epic secure chat 7am-7pm After these hours, please refer to coverage provider listed on [email or web address] 04/07/2020, 12:30 PM

## 2020-04-07 NOTE — TOC Progression Note (Signed)
Transition of Care [facility]) - Progression Note    Patient Details  Name: Donna Everett MRN: [id number] Date of Birth: June 08, 1935  [facility] CM/SW Contact  Doy Hutching, [place] Phone Number: 04/07/2020, 3:15 PM  Clinical Narrative:    Provided update to SNF, new COVID swab pending. Once medical work up complete will assist with return to [facility].    Expected Discharge Plan: Skilled Nursing Facility Barriers to Discharge: Continued Medical Work up  Expected Discharge Plan and Services Expected Discharge Plan: Skilled Nursing Facility In-house Referral: Clinical Social Work Discharge Planning Services: CM Consult Post Acute Care Choice: Resumption of Paediatric nurse, Skilled Nursing Facility Living arrangements for the past 2 months: Skilled Nursing Facility    Readmission Risk Interventions No flowsheet data found.

## 2020-04-08 DIAGNOSIS — R079 Chest pain, unspecified: Secondary | ICD-10-CM | POA: Diagnosis not present

## 2020-04-08 LAB — NM MYOCAR MULTI W/SPECT W/WALL MOTION / EF
Estimated workload: 1 METS
Exercise duration (min): 17 min
Exercise duration (sec): 33 s
MPHR: 137 {beats}/min
Peak HR: 121 {beats}/min
Percent HR: 88 %
Rest HR: 82 {beats}/min

## 2020-04-08 MED ORDER — METOPROLOL TARTRATE 25 MG PO TABS
12.5000 mg | ORAL_TABLET | Freq: Two times a day (BID) | ORAL | Status: DC
Start: 2020-04-08 — End: 2024-02-26

## 2020-04-08 MED ORDER — METOPROLOL TARTRATE 12.5 MG HALF TABLET
12.5000 mg | ORAL_TABLET | Freq: Two times a day (BID) | ORAL | Status: DC
  Administered 2020-04-08: 12.5 mg via ORAL
  Filled 2020-04-08: qty 1

## 2020-04-08 MED ORDER — REGADENOSON 0.4 MG/5ML IV SOLN
INTRAVENOUS | Status: AC
  Filled 2020-04-08: qty 5

## 2020-04-08 MED ORDER — REGADENOSON 0.4 MG/5ML IV SOLN
0.4000 mg | Freq: Once | INTRAVENOUS | Status: AC
  Administered 2020-04-08: 0.4 mg via INTRAVENOUS

## 2020-04-08 NOTE — Social Work (Signed)
Clinical Social Worker facilitated patient discharge including contacting patient family and facility to confirm patient discharge plans.  Clinical information faxed to facility and family agreeable with plan.  CSW arranged ambulance transport via PT[facility] to [facility] RN to call [phone]  with report prior to discharge.  Clinical Social Worker will sign off for now as social work intervention is no longer needed. Please consult us again if new need arises.  [name], MSW, LCSW Clinical Social Worker   

## 2020-04-08 NOTE — TOC Transition Note (Signed)
Transition of Care [facility]) - CM/SW Discharge Note   Patient Details  Name: Donna Everett MRN: [id number] Date of Birth: Oct 02, 1934  [facility]) CM/SW Contact:  Doy Hutching, LCSW Phone Number: 04/08/2020, 1:48 PM   Clinical Narrative:    CSW spoke with pt daughter Donna Everett, updated her about pt dc today to return to [facility]. PTAR papers on chart, no controlled scripts noted at this time. Pieter Partridge, RN has scheduled PTAR and will call report. All discharge paperwork sent to SNF.   Final next level of care: Skilled Nursing Facility Barriers to Discharge: Barriers Resolved   Patient Goals and CMS Choice Patient states their goals for this hospitalization and ongoing recovery are:: get up and walk again CMS Medicare.gov Compare Post Acute Care list provided to:: Patient (resident at [facility]- plan for return to same facility) Choice offered to / list presented to : Patient  Discharge Placement Existing PASRR number confirmed : 04/06/20          Patient chooses bed at: [facility] Patient to be transferred to facility by: PTAR Name of family member notified: pt daughter Donna Everett via telephone Patient and family notified of of transfer: 04/08/20  Discharge Plan and Services In-house Referral: Clinical Social Work Discharge Planning Services: CM Consult Post Acute Care Choice: Resumption of Paediatric nurse, Skilled Nursing Facility   Readmission Risk Interventions No flowsheet data found.

## 2020-04-08 NOTE — Progress Notes (Signed)
The patient has received Lexiscan dose without complications or distress. The appropriate staff was present during this test.   

## 2020-04-08 NOTE — Care Management Important Message (Signed)
Important Message  Patient Details  Name: Donna Everett MRN: [id number] Date of Birth: 06-07-1935   Medicare Important Message Given:  Yes - Important Message mailed due to current National Emergency  Verbal consent obtained due to current National Emergency  Relationship to patient: Child Contact Name: Kathi Simpers Call Date: 04/08/20  Time: 0946 Phone: [phone] Outcome: Spoke with contact Important Message mailed to: Patient address on file    Orson Aloe 04/08/2020, 9:46 AM

## 2020-04-08 NOTE — Progress Notes (Signed)
Physical Therapy Treatment Patient Details Name: Donna Everett MRN: [id number] DOB: Oct 25, 1934 Today's Date: 04/08/2020    History of Present Illness Pt is an 85 y/o female admitted from [facility] secondary to chest pain. Workup pending. PMH includes RA and HTN.     PT Comments    The pt was in bed upon arrival of PT, and agreeable to session prior to anticipated d/c. Upon initial movements, the pt was found to be soiled and unaware, so the session was used to assist with mobility to allow for cleaning of the pt with the NT. The pt attempted standing x2 at the EOB with HHA of 2, but was unable to generate enough hip clearance due to knee ROM limitations. The pt was then returned to supine in the bed where she completed multiple rolls and movements to allow for cleaning. The pt will continue to benefit from skilled PT following d/c to progress mobility as well as functional ROM for transfers.     Follow Up Recommendations  SNF     Equipment Recommendations  None recommended by PT    Recommendations for Other Services       Precautions / Restrictions Precautions Precautions: Fall Restrictions Weight Bearing Restrictions: No    Mobility  Bed Mobility Overal bed mobility: Needs Assistance Bed Mobility: Supine to Sit;Sit to Supine     Supine to sit: Supervision Sit to supine: Supervision   General bed mobility comments: Supervision for safety, increased time and use of rails with HOB elevated. able to return to supine and assist in scooting up in bed with minA of 2  Transfers Overall transfer level: Needs assistance Equipment used: 2 person hand held assist Transfers: Sit to/from Stand Sit to Stand: Total assist;+2 physical assistance;From elevated surface         General transfer comment: pt attempted x2 to complete standing, unable to achieve hip lift despite maxA of 2     Balance Overall balance assessment: Needs assistance Sitting-balance support: No upper extremity  supported;Feet supported Sitting balance-Leahy Scale: Fair         Standing balance comment: unable to come to standing with +2 assist.                            Cognition Arousal/Alertness: Awake/alert Behavior During Therapy: WFL for tasks assessed/performed Overall Cognitive Status: Within Functional Limits for tasks assessed                                 General Comments: pt incontinent of urine upon PT arrival, unaware of soiled sheets until informed by PT             Pertinent Vitals/Pain Pain Assessment: No/denies pain Pain Intervention(s): Limited activity within patient's tolerance;Monitored during session           PT Goals (current goals can now be found in the care plan section) Acute Rehab PT Goals Patient Stated Goal: to move better  PT Goal Formulation: With patient Time For Goal Achievement: 04/19/20 Potential to Achieve Goals: Fair Progress towards PT goals: Progressing toward goals    Frequency    Min 2X/week      PT Plan Current plan remains appropriate       AM-PAC PT "6 Clicks" Mobility   Outcome Measure  Help needed turning from your back to your side while in a flat bed without using bedrails?:  A Little Help needed moving from lying on your back to sitting on the side of a flat bed without using bedrails?: A Little Help needed moving to and from a bed to a chair (including a wheelchair)?: Total Help needed standing up from a chair using your arms (e.g., wheelchair or bedside chair)?: Total Help needed to walk in hospital room?: Total Help needed climbing 3-5 steps with a railing? : Total 6 Click Score: 10    End of Session Equipment Utilized During Treatment: Gait belt Activity Tolerance: Patient tolerated treatment well Patient left: in bed;with call bell/phone within reach;with bed alarm set Nurse Communication: Mobility status PT Visit Diagnosis: Unsteadiness on feet (R26.81);Muscle weakness  (generalized) (M62.81);Difficulty in walking, not elsewhere classified (R26.2)     Time: 1450-1516 PT Time Calculation (min) (ACUTE ONLY): 26 min  Charges:  $Therapeutic Activity: 23-37 mins                     Rolm Baptise, PT, DPT   Acute Rehabilitation Department Pager #: [phone]   Gaetana Michaelis 04/08/2020, 3:34 PM

## 2020-04-08 NOTE — Discharge Summary (Addendum)
Physician Discharge Summary  Donna Everett GYJ:[id number] DOB: Jul 04, 1935 DOA: 04/04/2020  PCP: Patient, No Pcp Per  Admit date: 04/04/2020 Discharge date: 04/08/2020  Admitted From: SNF Discharge disposition: SNF   Recommendations for Outpatient Follow-Up:   1. Stress test pending read: needs follow up with cardiology (in 2017 they recommended to start BB- 12.5 mg BID) 2. Referral to pulm for follow up of pulm nodules 3  On echo: The AV is poorly seen. No obvious AS but recommend repeat limited echo  to focus on AV and doppler assessment   Discharge Diagnosis:   Principal Problem:   Chest pain Active Problems:   Rheumatoid arthritis (HCC)   Heart murmur   Pulmonary nodule   Essential hypertension    Discharge Condition: Improved.  Diet recommendation: Low sodium, heart healthy  Wound care: None.  Code status: Full.   History of Present Illness:   Donna Everett is a 85 y.o. female with medical history significant for arthritis, hypertension presents by EMS from a skilled nursing facility with complaint of chest pressure.  She reports that she has developed a substernal chest pressure that does not radiate.  She reports it is a dull pressure-like sensation like something heavy sitting on her chest.  She has had some mild shortness of breath and nausea associated with the chest pressure.  Symptoms began in the afternoon and is continued into the evening she became concerned and called 911.  She states that she had a stress test many years ago but has never had a cardiac catheterization done.  She has been in rehab since April secondary to generalized weakness and having difficulty caring for self at home.  She reports that due to the Covid 19 pandemic services have not been staffed and this is caused her to be at the rehab center for a prolonged period of time.  She states she has not had any fever, cough, chills, vomiting, diarrhea, abdominal pain, urinary  symptoms.  She does not take any medicines for the chest pressure today.  States that she does take an aspirin every day.  She does have a history of a heart murmur but states she has never had an echocardiogram. She denies any tobacco, alcohol, illicit drug use.    Hospital Course by Problem:   Atypical chest pain Patient presenting to the ED via EMS following acute onset chest pressure without radiation that has been persistent.  EKG with left bundle branch block which is old and no ST elevation/depression or concerning T wave inversion.  Troponin have been negative.  EKG unrevealing.  Lipid panel with total cholesterol 151, HDL 59, LDL 80, triglycerides 61.  TTE with LVEF 60-65%, no LV regional wall motion abnormalities, severe thickening aortic valve with no evidence of aortic stenosis. --Nuclear med stress test: Pending results (apparantly machine to read is not working so awaiting a Pensions consultant to fix) --Continue aspirin 81 mg p.o. daily -per chart review: from 2017 LBBB - saw cardiology and considering starting on metoprolol, has f/u scheduled with Dr. Jodie Echevaria - incidental septal infarct on nuclear stress but cards though this is artifact from LBBB   Pulmonary nodules CT angiogram chest notable for multiple pulmonary nodules, largest 9 mm cavitary left apex consistent with postinfectious versus inflammatory versus cavitary neoplasm.  Patient well aware of her pulmonary nodules, history of sarcoidosis has been followed in the distant past by her PCP.  Radiology recommending interval follow-up 74-month CT chest versus PET CT versus tissue sample.  But unable to perform percutaneous sample due to lack of soft tissue per their report. --ambulatory referral to pulmonology for further evaluation/surveillance following discharge  Essential hypertension --Continue HCTZ 12.5 mg p.o. daily -add low dose BB  Generalized weakness, debility: Patient has been a resident of [facility] since  April 2021 for generalized weakness, debility and unable to care for self.  She states therapy has not worked with her over the past 1-2 months; and she would like to get more ambulatory. --PT/OT recommending SNF, plan to return back to [facility]  Moderate protein calorie malnutrition Body mass index is 19.22 kg/m. --Nutrition consult placed  Arthritis -consider splints at night   Medical Consultants:      Discharge Exam:   Vitals:   04/08/20 1151 04/08/20 1327  BP: (!) 115/59 (!) 146/62  Pulse: 95 (!) 110  Resp: 16 16  Temp: 98 F (36.7 C) 98 F (36.7 C)  SpO2: 100% 98%   Vitals:   04/08/20 0941 04/08/20 0944 04/08/20 1151 04/08/20 1327  BP:   (!) 115/59 (!) 146/62  Pulse: (!) 111 (!) 109 95 (!) 110  Resp:   16 16  Temp:   98 F (36.7 C) 98 F (36.7 C)  TempSrc:   Oral Oral  SpO2:   100% 98%  Weight:      Height:        General exam: Appears calm and comfortable.   The results of significant diagnostics from this hospitalization (including imaging, microbiology, ancillary and laboratory) are listed below for reference.     Procedures and Diagnostic Studies:   DG Chest 2 View  Result Date: 04/04/2020 CLINICAL DATA:  Increased heart rate this morning. Intermittent left-sided chest and axillary pain. No shortness of breath. EXAM: CHEST - 2 VIEW COMPARISON:  None. FINDINGS: The heart, hila, and mediastinum are normal. No pneumothorax. No nodules or masses. Increased interstitial opacities in the lungs are age indeterminate. No focal infiltrate. IMPRESSION: Increased interstitial opacities in the lungs are age indeterminate. Atypical infection could have this appearance. Recommend clinical correlation and attention on short-term follow-up. Electronically Signed   By: Gerome Sam III M.D   On: 04/04/2020 13:02   CT Angio Chest PE W and/or Wo Contrast  Result Date: 04/04/2020 CLINICAL DATA:  Tachycardia, chest pain, elevated D-dimer EXAM: CT ANGIOGRAPHY  CHEST WITH CONTRAST TECHNIQUE: Multidetector CT imaging of the chest was performed using the standard protocol during bolus administration of intravenous contrast. Multiplanar CT image reconstructions and MIPs were obtained to evaluate the vascular anatomy. CONTRAST:  49mL OMNIPAQUE IOHEXOL 350 MG/ML SOLN COMPARISON:  None. FINDINGS: Cardiovascular: There is excellent opacification of the pulmonary arterial tree. No intraluminal filling defect identified to suggest acute pulmonary embolism. The central pulmonary arteries are markedly enlarged in keeping with changes of pulmonary arterial hypertension. There is marked left ventricular hypertrophy with resultant mild global cardiomegaly. No significant coronary artery calcification. No pericardial effusion. The thoracic aorta is of normal caliber. Mild atherosclerotic calcification noted within its descending segment. 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 Arch vasculature is widely patent proximally. Mediastinum/Nodes: No pathologic thoracic adenopathy. Lungs/Pleura: There is bronchiectasis and architectural distortion involving the lingula and left lower lobe with air trapping within the left lower lobe likely related to small airways disease. The focality of these changes suggest a post infectious etiology. Bilateral upper lobe bronchiectasis is also seen time a left severe. No  associated nodularity or focal pulmonary infiltrate. A cavitary nodule is seen within the left apex, 9 mm, axial image # 22/6, demonstrating a thin uniform rind, nonspecific. While this may represent a a post infectious or post inflammatory cavity, a cavitary neoplasm is difficult to exclude. 3 mm noncalcified indeterminate pulmonary nodule, right upper lobe, axial image # 69/6. Upper Abdomen: No acute abnormality. Musculoskeletal: No acute bone abnormality Review of the MIP images confirms the  above findings. IMPRESSION: No pulmonary embolism. Marked left ventricular hypertrophy. Marked pulmonary arterial enlargement suggesting changes of pulmonary artery hypertension, possibly related to diastolic dysfunction. Multifocal areas of bronchiectasis, most severe within the left lower lobe, likely post infectious in etiology. No superimposed active pulmonary infiltrate identified. Multiple pulmonary nodules. 9 mm cavitary nodule within the left apex may be post infectious or inflammatory in nature, however, a cavitary neoplasm should be excluded. Consider one of the following in 3 months for both low-risk and high-risk individuals: (a) repeat chest CT, (b) follow-up PET-CT, or (c) tissue sampling. Note that attempt at percutaneous tissue sampling may be unlikely to yield a diagnostic sample given the lack of significant associated soft tissue. This recommendation follows the consensus statement: Guidelines for Management of Incidental Pulmonary Nodules Detected on CT Images: From the Fleischner Society 2017; Radiology 2017; 284:228-243. Aortic Atherosclerosis (ICD10-I70.0). Electronically Signed   By: Helyn Numbers MD   On: 04/04/2020 23:25   ECHOCARDIOGRAM COMPLETE  Result Date: 04/05/2020    ECHOCARDIOGRAM REPORT   Patient Name:   YOSHIE KOSEL Date of Exam: 04/05/2020 Medical Rec #:  [id number]         Height:       65.0 in Accession #:    [id number]        Weight:       115.5 lb Date of Birth:  October 02, 1934         BSA:          1.567 m Patient Age:    85 years          BP:           146/76 mmHg Patient Gender: F                 HR:           93 bpm. Exam Location:  Inpatient Procedure: 2D Echo, Cardiac Doppler and Color Doppler Indications:    Murmur 785.2 / R01.1  History:        Patient has no prior history of Echocardiogram examinations.                 Signs/Symptoms:Murmur and Chest Pain; Risk Factors:Hypertension                 and Non-Smoker.  Sonographer:    Renella Cunas RDCS Referring Phys:  [id number] BRADLEY S CHOTINER IMPRESSIONS  1. Left ventricular ejection fraction, by estimation, is 60 to 65%. The left ventricle has normal function. The left ventricle has no regional wall motion abnormalities. Indeterminate diastolic filling due to E-A fusion.  2. Right ventricular systolic function is normal. The right ventricular size is normal. Tricuspid regurgitation signal is inadequate for assessing PA pressure.  3. The mitral valve is normal in structure. No evidence of mitral valve regurgitation. No evidence of mitral stenosis. Moderate mitral annular calcification.  4. The aortic valve is tricuspid. There is severe calcifcation of the aortic valve. There is severe thickening of the aortic valve. Aortic valve regurgitation is not visualized. Moderate aortic  valve sclerosis/calcification is present, without any evidence of aortic stenosis.  5. The inferior vena cava is normal in size with greater than an 50% respiratory variability, suggesting right atrial pressure of 3 mmHg.  6. The AV is poorly seen. No obvious AS but recommend repeat limited echo to focus on AV and doppler assessment. FINDINGS  Left Ventricle: Left ventricular ejection fraction, by estimation, is 60 to 65%. The left ventricle has normal function. The left ventricle has no regional wall motion abnormalities. The left ventricular internal cavity size was normal in size. There is  no left ventricular hypertrophy. Indeterminate diastolic filling due to E-A fusion. Right Ventricle: The right ventricular size is normal. No increase in right ventricular wall thickness. Right ventricular systolic function is normal. Tricuspid regurgitation signal is inadequate for assessing PA pressure. Left Atrium: Left atrial size was normal in size. Right Atrium: Right atrial size was normal in size. Pericardium: There is no evidence of pericardial effusion. Mitral Valve: The mitral valve is normal in structure. Moderate mitral annular calcification. No  evidence of mitral valve regurgitation. No evidence of mitral valve stenosis. Tricuspid Valve: The tricuspid valve is normal in structure. Tricuspid valve regurgitation is trivial. No evidence of tricuspid stenosis. Aortic Valve: The aortic valve is tricuspid. There is severe calcifcation of the aortic valve. There is severe thickening of the aortic valve. Aortic valve regurgitation is not visualized. Moderate aortic valve sclerosis/calcification is present, without  any evidence of aortic stenosis. Pulmonic Valve: The pulmonic valve was normal in structure. Pulmonic valve regurgitation is not visualized. No evidence of pulmonic stenosis. Aorta: The aortic root is normal in size and structure. Venous: The inferior vena cava is normal in size with greater than 50% respiratory variability, suggesting right atrial pressure of 3 mmHg. IAS/Shunts: No atrial level shunt detected by color flow Doppler.  LEFT VENTRICLE PLAX 2D LVIDd:         4.20 cm     Diastology LVIDs:         2.80 cm     LV e' medial:    4.90 cm/s LV PW:         0.70 cm     LV E/e' medial:  16.5 LV IVS:        0.70 cm     LV e' lateral:   6.53 cm/s LVOT diam:     2.10 cm     LV E/e' lateral: 12.4 LV SV:         57 LV SV Index:   36 LVOT Area:     3.46 cm  LV Volumes (MOD) LV vol d, MOD A2C: 89.8 ml LV vol d, MOD A4C: 53.0 ml LV vol s, MOD A2C: 41.9 ml LV vol s, MOD A4C: 31.0 ml LV SV MOD A2C:     47.9 ml LV SV MOD A4C:     53.0 ml LV SV MOD BP:      34.6 ml RIGHT VENTRICLE RV S prime:     14.90 cm/s TAPSE (M-mode): 2.1 cm LEFT ATRIUM           Index       RIGHT ATRIUM          Index LA diam:      3.00 cm 1.92 cm/m  RA Area:     8.31 cm LA Vol (A2C): 28.1 ml 17.94 ml/m RA Volume:   15.70 ml 10.02 ml/m LA Vol (A4C): 14.4 ml 9.19 ml/m  AORTIC VALVE LVOT Vmax:  96.70 cm/s LVOT Vmean:  60.800 cm/s LVOT VTI:    0.165 m  AORTA Ao Root diam: 2.90 cm MITRAL VALVE MV Area (PHT): 5.31 cm     SHUNTS MV Decel Time: 143 msec     Systemic VTI:  0.16 m MV E  velocity: 81.00 cm/s   Systemic Diam: 2.10 cm MV A velocity: 135.00 cm/s MV E/A ratio:  0.60 Armanda Magic MD Electronically signed by Armanda Magic MD Signature Date/Time: 04/05/2020/11:29:46 AM    Final      Labs:   Basic Metabolic Panel: Recent Labs  Lab 04/04/20 1240 04/05/20 0246  NA 137  --   K 3.8  --   CL 100  --   CO2 26  --   GLUCOSE 127*  --   BUN 22  --   CREATININE 0.58 0.64  CALCIUM 10.3  --    GFR Estimated Creatinine Clearance: 44.1 mL/min (by C-G formula based on SCr of 0.64 mg/dL). Liver Function Tests: No results for input(s): AST, ALT, ALKPHOS, BILITOT, PROT, ALBUMIN in the last 168 hours. No results for input(s): LIPASE, AMYLASE in the last 168 hours. No results for input(s): AMMONIA in the last 168 hours. Coagulation profile No results for input(s): INR, PROTIME in the last 168 hours.  CBC: Recent Labs  Lab 04/04/20 1240 04/05/20 0246  WBC 5.2 4.5  HGB 12.3 12.1  HCT 39.8 37.7  MCV 92.1 92.6  PLT 287 196   Cardiac Enzymes: No results for input(s): CKTOTAL, CKMB, CKMBINDEX, TROPONINI in the last 168 hours. BNP: Invalid input(s): POCBNP CBG: No results for input(s): GLUCAP in the last 168 hours. D-Dimer No results for input(s): DDIMER in the last 72 hours. Hgb A1c No results for input(s): HGBA1C in the last 72 hours. Lipid Profile No results for input(s): CHOL, HDL, LDLCALC, TRIG, CHOLHDL, LDLDIRECT in the last 72 hours. Thyroid function studies No results for input(s): TSH, T4TOTAL, T3FREE, THYROIDAB in the last 72 hours.  Invalid input(s): FREET3 Anemia work up No results for input(s): VITAMINB12, FOLATE, FERRITIN, TIBC, IRON, RETICCTPCT in the last 72 hours. Microbiology Recent Results (from the past 240 hour(s))  SARS Coronavirus 2 by RT PCR (hospital order, performed in [facility] hospital lab) Nasopharyngeal Nasopharyngeal Swab     Status: None   Collection Time: 04/05/20 12:05 AM   Specimen: Nasopharyngeal Swab  Result Value Ref  Range Status   SARS Coronavirus 2 NEGATIVE NEGATIVE Final    Comment: (NOTE) SARS-CoV-2 target nucleic acids are NOT DETECTED.  The SARS-CoV-2 RNA is generally detectable in upper and lower respiratory specimens during the acute phase of infection. The lowest concentration of SARS-CoV-2 viral copies this assay can detect is 250 copies / mL. A negative result does not preclude SARS-CoV-2 infection and should not be used as the sole basis for treatment or other patient management decisions.  A negative result may occur with improper specimen collection / handling, submission of specimen other than nasopharyngeal swab, presence of viral mutation(s) within the areas targeted by this assay, and inadequate number of viral copies (<250 copies / mL). A negative result must be combined with clinical observations, patient history, and epidemiological information.  Fact Sheet for Patients:   BoilerBrush.com.cy  Fact Sheet for Healthcare Providers: https://pope.com/  This test is not yet approved or  cleared by the [place] FDA and has been authorized for detection and/or diagnosis of SARS-CoV-2 by FDA under an Emergency Use Authorization (EUA).  This EUA will remain in effect (meaning  this test can be used) for the duration of the COVID-19 declaration under Section 564(b)(1) of the Act, 21 U.S.C. section 360bbb-3(b)(1), unless the authorization is terminated or revoked sooner.  Performed at [facility], [address] [place], [place] [address]   SARS Coronavirus 2 by RT PCR (hospital order, performed in [facility] hospital lab) Nasopharyngeal Nasopharyngeal Swab     Status: None   Collection Time: 04/07/20  2:22 PM   Specimen: Nasopharyngeal Swab  Result Value Ref Range Status   SARS Coronavirus 2 NEGATIVE NEGATIVE Final    Comment: (NOTE) SARS-CoV-2 target nucleic acids are NOT DETECTED.  The SARS-CoV-2 RNA is generally  detectable in upper and lower respiratory specimens during the acute phase of infection. The lowest concentration of SARS-CoV-2 viral copies this assay can detect is 250 copies / mL. A negative result does not preclude SARS-CoV-2 infection and should not be used as the sole basis for treatment or other patient management decisions.  A negative result may occur with improper specimen collection / handling, submission of specimen other than nasopharyngeal swab, presence of viral mutation(s) within the areas targeted by this assay, and inadequate number of viral copies (<250 copies / mL). A negative result must be combined with clinical observations, patient history, and epidemiological information.  Fact Sheet for Patients:   BoilerBrush.com.cy  Fact Sheet for Healthcare Providers: https://pope.com/  This test is not yet approved or  cleared by the [place] FDA and has been authorized for detection and/or diagnosis of SARS-CoV-2 by FDA under an Emergency Use Authorization (EUA).  This EUA will remain in effect (meaning this test can be used) for the duration of the COVID-19 declaration under Section 564(b)(1) of the Act, 21 U.S.C. section 360bbb-3(b)(1), unless the authorization is terminated or revoked sooner.  Performed at [facility], [address], [place], [place] [address]      Discharge Instructions:   Discharge Instructions    Ambulatory referral to Pulmonology   Complete by: As directed    Hx of sarcoidosis with multiple lung nodules/cavitary lesions   Reason for referral: Lung Mass/Lung Nodule   Diet - low sodium heart healthy   Complete by: As directed    Increase activity slowly   Complete by: As directed      Allergies as of 04/08/2020      Reactions   Erythromycin Base Swelling, Other (See Comments)   Edema in hands, not throat   Ascorbate Other (See Comments)   Urinary frequency   Aspirin Other  (See Comments)   "Allergic," per [facility]   Doxycycline Other (See Comments)   "Stomach spasms, sick , light-headed"   Nickel Dermatitis   Codeine Other (See Comments)   "Allergic," per [facility]   Caffeine Anxiety, Other (See Comments)   "very jittery"   Ibuprofen Other (See Comments)   Cannot take due to liver problems, hepatitis      Medication List    TAKE these medications   acetaminophen 325 MG tablet Commonly known as: TYLENOL Take 650 mg by mouth every 6 (six) hours as needed (for pain).   aspirin EC 81 MG tablet Take 81 mg by mouth in the morning. Swallow whole.   DERMACLOUD EX Apply 1 application topically See admin instructions. Apply to buttocks 2 times a day- after incontinent care   hydrochlorothiazide 12.5 MG capsule Commonly known as: MICROZIDE Take 12.5 mg by mouth in the morning.   metoprolol tartrate 25 MG tablet Commonly known as: LOPRESSOR  Take 0.5 tablets (12.5 mg total) by mouth 2 (two) times daily.   NON FORMULARY Take 90 mLs by mouth See admin instructions. MedPass 2.0: Drink 90 ml's by mouth two times a day   polyethylene glycol powder 17 GM/SCOOP powder Commonly known as: GLYCOLAX/MIRALAX Take 17 g by mouth in the morning.   simethicone 80 MG chewable tablet Commonly known as: MYLICON Chew 80 mg by mouth 3 (three) times daily as needed for flatulence (BEFORE MEALS).   tizanidine 2 MG capsule Commonly known as: ZANAFLEX Take 2 mg by mouth at bedtime.   trolamine salicylate 10 % cream Commonly known as: ASPERCREME Apply 1 application topically as needed for muscle pain.   Vitamin D3 50 MCG (2000 UT) Tabs Take 2,000 Units by mouth in the morning.   Voltaren 1 % Gel Generic drug: diclofenac Sodium Apply 2 g topically 2 (two) times daily as needed (to affected area for pain).       Contact information for after-discharge care    Destination    [facility] .   Service: Skilled Nursing Contact  information: [address] [place] [address] [phone]                   Time coordinating discharge: 35 min  Signed:  Joseph Art DO  [facility] 04/08/2020, 1:35 PM

## 2020-04-09 ENCOUNTER — Other Ambulatory Visit: Payer: Self-pay | Admitting: Internal Medicine

## 2020-04-29 ENCOUNTER — Institutional Professional Consult (permissible substitution): Payer: Medicare Other | Admitting: Pulmonary Disease

## 2021-02-28 ENCOUNTER — Ambulatory Visit (INDEPENDENT_AMBULATORY_CARE_PROVIDER_SITE_OTHER): Payer: Medicare Other | Admitting: Podiatry

## 2021-02-28 ENCOUNTER — Encounter: Payer: Self-pay | Admitting: Podiatry

## 2021-02-28 ENCOUNTER — Other Ambulatory Visit: Payer: Self-pay

## 2021-02-28 DIAGNOSIS — M79674 Pain in right toe(s): Secondary | ICD-10-CM

## 2021-02-28 DIAGNOSIS — M79675 Pain in left toe(s): Secondary | ICD-10-CM | POA: Diagnosis not present

## 2021-02-28 DIAGNOSIS — B351 Tinea unguium: Secondary | ICD-10-CM

## 2021-02-28 NOTE — Progress Notes (Signed)
This patient presents to the office for evaluation and treatment of long thick painful nails .  This patient is unable to trim her own nails since the patient cannot reach her feet.  Patient says the nails are painful walking and wearing his shoes. This patient presents to the office in a wheelchair  accompanied by her daughter.  She returns for preventive foot care services.  General Appearance  Alert, conversant and in no acute stress.  Vascular  Dorsalis pedis and posterior tibial  pulses are absent  bilaterally.  Capillary return is within normal limits  bilaterally. Cold feet.  Absent digital hair  B/L. bilaterally.  Neurologic  Senn-Weinstein monofilament wire test within normal limits  bilaterally. Muscle power within normal limits bilaterally.  Nails Thick disfigured discolored nails with subungual debris  from hallux to fifth toes bilaterally. No evidence of bacterial infection or drainage bilaterally.  Orthopedic  No limitations of motion  feet .  No crepitus or effusions noted.  HAV  B/L with overlapping second digits  B/L.  Skin  normotropic skin with no porokeratosis noted bilaterally.  No signs of infections or ulcers noted.     Onychomycosis  Pain in toes right foot  Pain in toes left foot  Debridement  of nails  1-5  B/L with a nail nipper.  Nails were then filed using a dremel tool with no incidents.    RTC  3 months   Helane Gunther DPM

## 2021-05-30 ENCOUNTER — Ambulatory Visit: Payer: Medicare Other | Admitting: Podiatry

## 2021-11-07 IMAGING — CT CT ANGIO CHEST
2 of 7 series · 17 of 46 positions shown · IV contrast (APPLIED)
Comparison: None.

CLINICAL DATA: Tachycardia, chest pain, elevated D-dimer

EXAM:
CT ANGIOGRAPHY CHEST WITH CONTRAST
TECHNIQUE: Multidetector CT imaging of the chest was performed using the
standard protocol during bolus administration of intravenous
contrast. Multiplanar CT image reconstructions and MIPs were
obtained to evaluate the vascular anatomy.
CONTRAST:  75mL OMNIPAQUE IOHEXOL 350 MG/ML SOLN

[Series 7: thins · axial · 0.49mm/px · z∈[-212,+0]mm · 14 of 341 slices shown]
[im 19/341  lung]
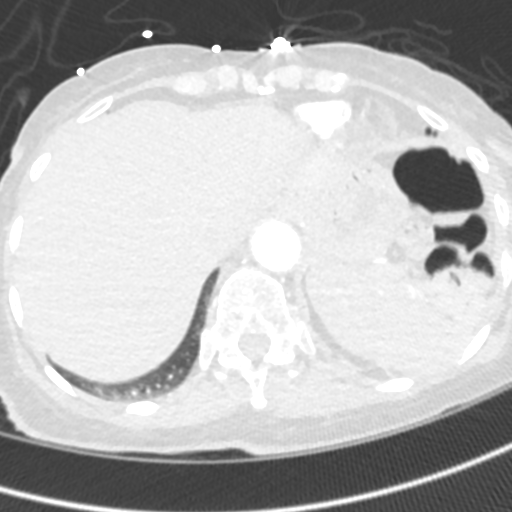
[im 38/341  soft-tissue]
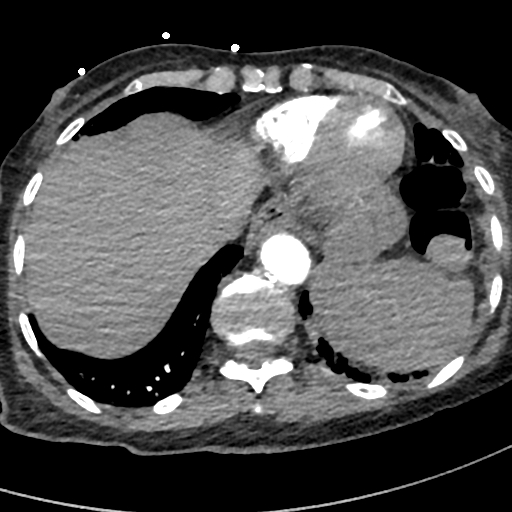
[im 76/341  lung]
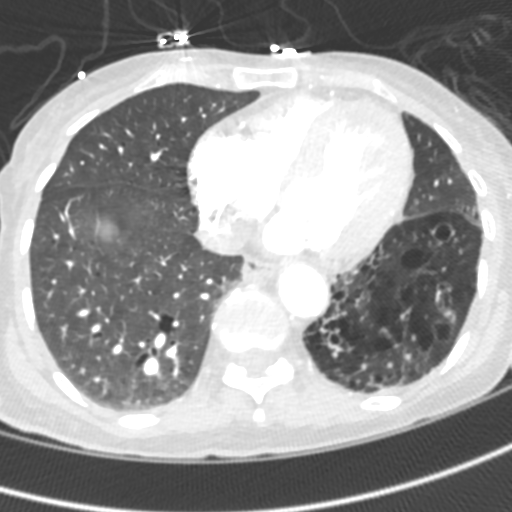
[im 95/341  soft-tissue]
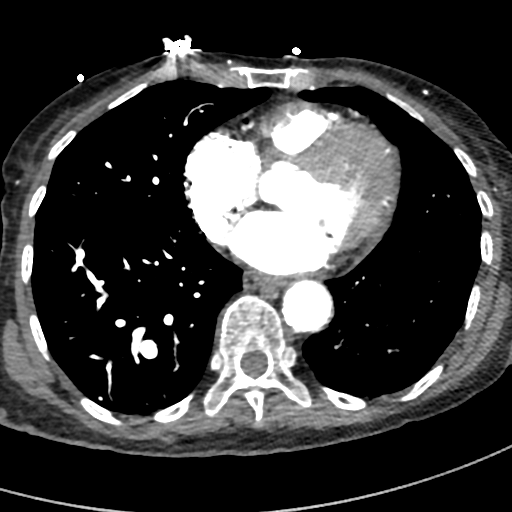
[im 114/341  lung]
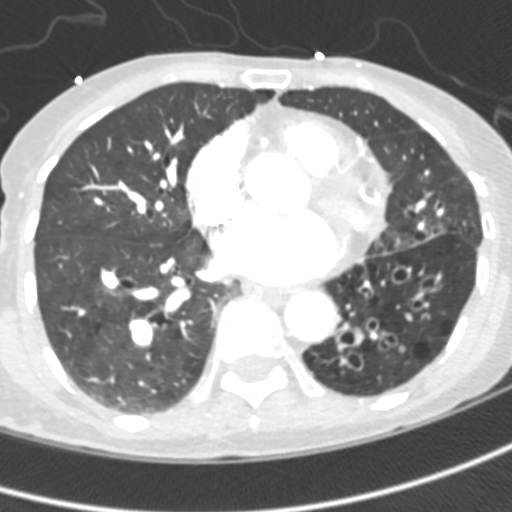
[im 133/341  soft-tissue]
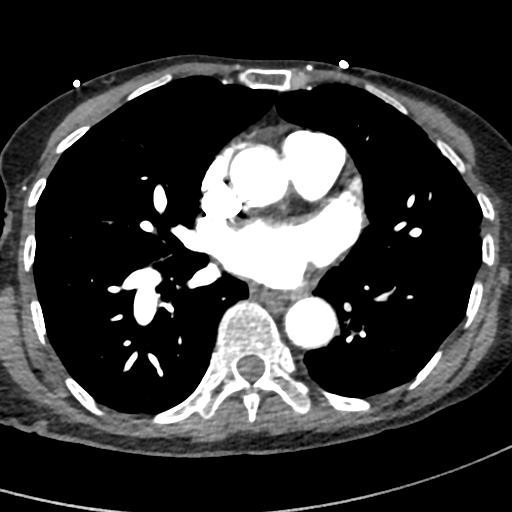
[im 152/341  lung]
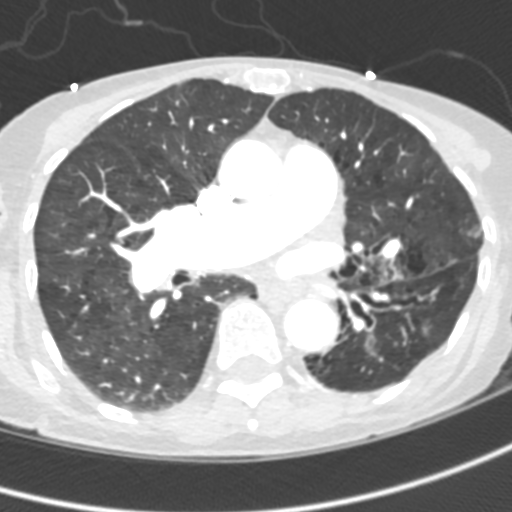
[im 189/341  soft-tissue]
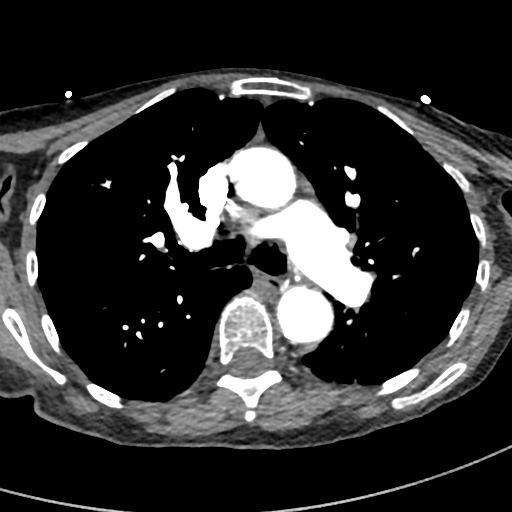
[im 208/341  lung]
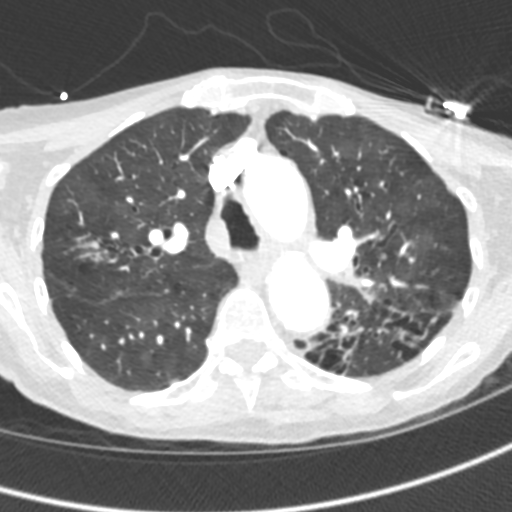
[im 227/341  soft-tissue]
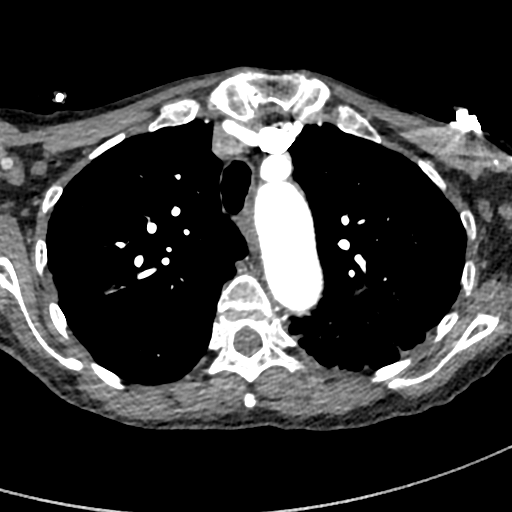
[im 246/341  lung]
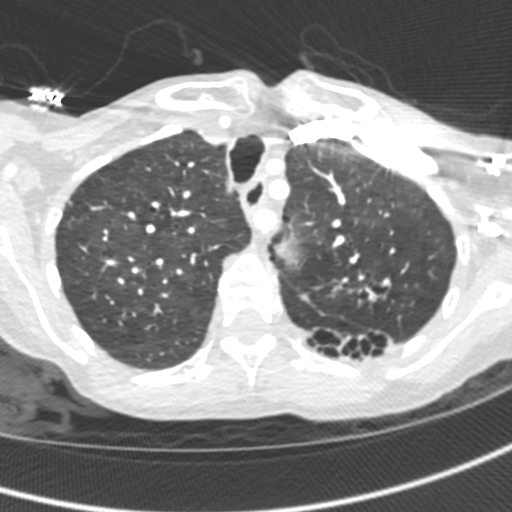
[im 265/341  soft-tissue]
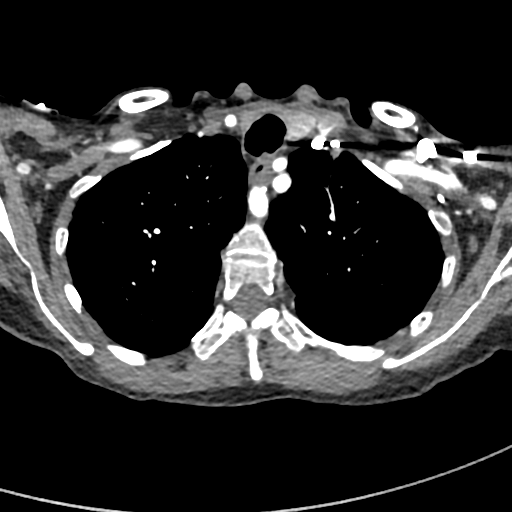
[im 303/341  lung]
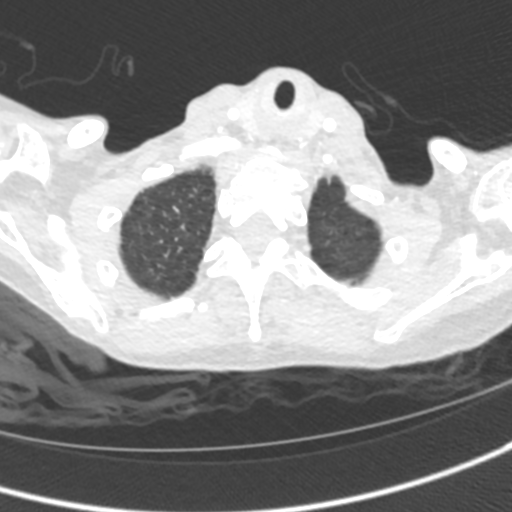
[im 322/341  soft-tissue]
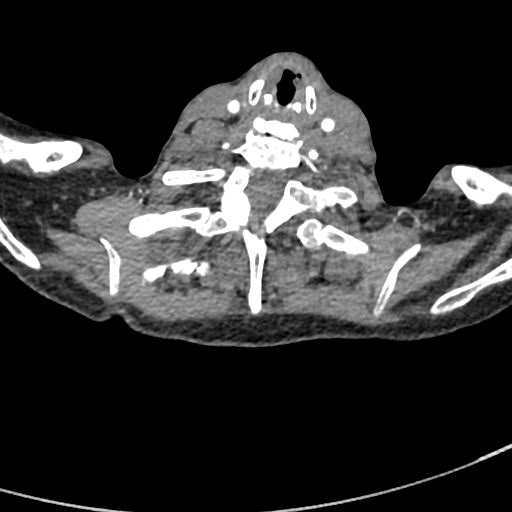

[Series 8: cor · coronal · 0.47mm/px · 3 of 105 slices shown]
[im 27/105  soft-tissue]
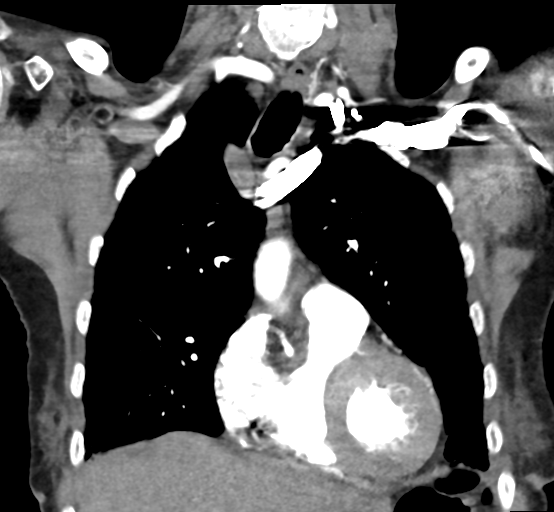
[im 53/105  soft-tissue]
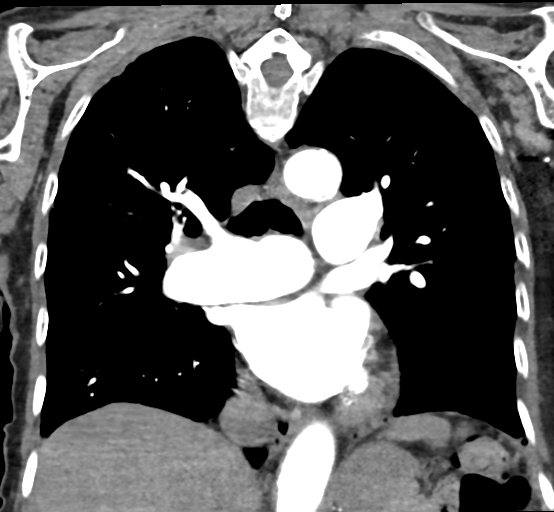
[im 79/105  soft-tissue]
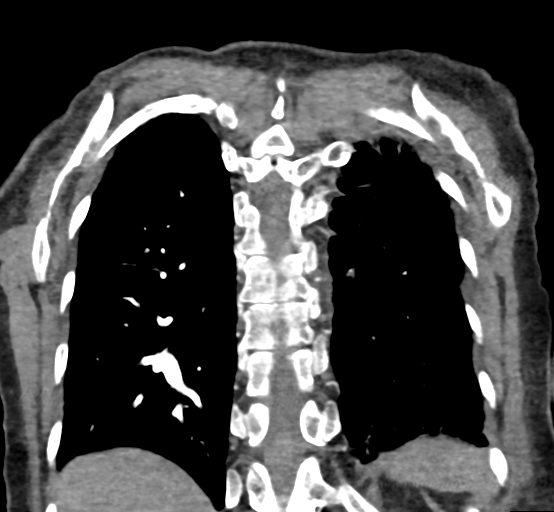

[17 of 46 positions shown; findings below may reference images not displayed]

FINDINGS: Cardiovascular: There is excellent opacification of the pulmonary
arterial tree. No intraluminal filling defect identified to suggest
acute pulmonary embolism. The central pulmonary arteries are
markedly enlarged in keeping with changes of pulmonary arterial
hypertension. There is marked left ventricular hypertrophy with
resultant mild global cardiomegaly. No significant coronary artery
calcification. No pericardial effusion. The thoracic aorta is of
normal caliber. Mild atherosclerotic calcification noted within its
descending segment. There is aberrant origin of the right subclavian
artery as well as a common origin of the carotid arteries, normal
anatomic variants. Arch vasculature is widely patent proximally.

Mediastinum/Nodes: No pathologic thoracic adenopathy.

Lungs/Pleura: There is bronchiectasis and architectural distortion
involving the lingula and left lower lobe with air trapping within
the left lower lobe likely related to small airways disease. The
focality of these changes suggest a post infectious etiology.

Bilateral upper lobe bronchiectasis is also seen time a left severe.
No associated nodularity or focal pulmonary infiltrate.

A cavitary nodule is seen within the left apex, 9 mm, axial image #
[DATE], demonstrating a thin uniform rind, nonspecific. While this may
represent a a post infectious or post inflammatory cavity, a
cavitary neoplasm is difficult to exclude.

3 mm noncalcified indeterminate pulmonary nodule, right upper lobe,
axial image # 69/6.

Upper Abdomen: No acute abnormality.

Musculoskeletal: No acute bone abnormality

Review of the MIP images confirms the above findings.
IMPRESSION: No pulmonary embolism.

Marked left ventricular hypertrophy. Marked pulmonary arterial
enlargement suggesting changes of pulmonary artery hypertension,
possibly related to diastolic dysfunction.

Multifocal areas of bronchiectasis, most severe within the left
lower lobe, likely post infectious in etiology. No superimposed
active pulmonary infiltrate identified.

Multiple pulmonary nodules. 9 mm cavitary nodule within the left
apex may be post infectious or inflammatory in nature, however, a
cavitary neoplasm should be excluded. Consider one of the following
in 3 months for both low-risk and high-risk individuals: (a) repeat
chest CT, (b) follow-up PET-CT, or (c) tissue sampling. Note that
attempt at percutaneous tissue sampling may be unlikely to yield a
diagnostic sample given the lack of significant associated soft
tissue. This recommendation follows the consensus statement:
Guidelines for Management of Incidental Pulmonary Nodules Detected

Aortic Atherosclerosis (H29I9-ZQ0.0).

## 2024-02-26 ENCOUNTER — Emergency Department (HOSPITAL_COMMUNITY)
Admission: EM | Admit: 2024-02-26 | Discharge: 2024-02-26 | Disposition: A | Discharge status: Home / Self Care | Attending: Emergency Medicine | Admitting: Emergency Medicine

## 2024-02-26 ENCOUNTER — Emergency Department (HOSPITAL_COMMUNITY)

## 2024-02-26 ENCOUNTER — Encounter (HOSPITAL_COMMUNITY): Payer: Self-pay | Admitting: Emergency Medicine

## 2024-02-26 ENCOUNTER — Other Ambulatory Visit: Payer: Self-pay

## 2024-02-26 DIAGNOSIS — R072 Precordial pain: Secondary | ICD-10-CM | POA: Diagnosis not present

## 2024-02-26 DIAGNOSIS — E8809 Other disorders of plasma-protein metabolism, not elsewhere classified: Secondary | ICD-10-CM | POA: Insufficient documentation

## 2024-02-26 DIAGNOSIS — R0789 Other chest pain: Secondary | ICD-10-CM | POA: Diagnosis present

## 2024-02-26 LAB — COMPREHENSIVE METABOLIC PANEL WITH GFR
ALT: 9 U/L (ref 0–44)
AST: 17 U/L (ref 15–41)
Albumin: 2.6 g/dL — ABNORMAL LOW (ref 3.5–5.0)
Alkaline Phosphatase: 51 U/L (ref 38–126)
Anion gap: 10 (ref 5–15)
BUN: 10 mg/dL (ref 8–23)
CO2: 24 mmol/L (ref 22–32)
Calcium: 10.1 mg/dL (ref 8.9–10.3)
Chloride: 102 mmol/L (ref 98–111)
Creatinine, Ser: 0.42 mg/dL — ABNORMAL LOW (ref 0.44–1.00)
GFR, Estimated: >60 mL/min
Glucose, Bld: 111 mg/dL — ABNORMAL HIGH (ref 70–99)
Potassium: 4 mmol/L (ref 3.5–5.1)
Sodium: 136 mmol/L (ref 135–145)
Total Bilirubin: 0.3 mg/dL (ref 0.0–1.2)
Total Protein: 7.2 g/dL (ref 6.5–8.1)

## 2024-02-26 LAB — TROPONIN I (HIGH SENSITIVITY)
Troponin I (High Sensitivity): 9 ng/L (ref <18)
Troponin I (High Sensitivity): 8 ng/L (ref <18)

## 2024-02-26 MED ORDER — ACETAMINOPHEN 325 MG PO TABS
650.0000 mg | ORAL_TABLET | Freq: Once | ORAL | Status: AC
  Administered 2024-02-26 (×2): 650 mg via ORAL
  Filled 2024-02-26: qty 2

## 2024-02-26 MED ORDER — ALUM & MAG HYDROXIDE-SIMETH 200-200-20 MG/5ML PO SUSP
30.0000 mL | Freq: Once | ORAL | Status: AC
  Administered 2024-02-26 (×2): 30 mL via ORAL
  Filled 2024-02-26: qty 30

## 2024-02-26 MED ORDER — FAMOTIDINE 20 MG PO TABS
20.0000 mg | ORAL_TABLET | Freq: Once | ORAL | Status: AC
  Administered 2024-02-26 (×2): 20 mg via ORAL
  Filled 2024-02-26: qty 1

## 2024-02-26 NOTE — ED Triage Notes (Signed)
 PT BIB [facility] from [facility] in [facility] for 8/10 Chest Pressure under Left breast.  No NV.   EMS gave 2 nitroglycerin,3/10 pain after.  PT has allergy to ASA.  No fluids given.   126/82 HR 98 99% RA, RR 18 T: 98.7  22G R. Thumb

## 2024-02-26 NOTE — ED Notes (Signed)
 Location of where pt's supposed to be upon d/c verified with son.

## 2024-02-26 NOTE — ED Notes (Signed)
 Several attempts made to reach out to [facility] to give report

## 2024-02-26 NOTE — ED Notes (Signed)
 PTAR present to  have pt transported

## 2024-02-26 NOTE — ED Provider Notes (Signed)
 [name] Provider Note   CSN: [id number] Arrival date & time: 02/26/24  1110     Patient presents with: Chest Pain   Donna Everett is a 89 y.o. female.   Pt c/o mid to left chest pain at rest this AM. Constant, dull, improving. No radiation of pain. No pleuritic pain. No associated sob, nv or diaphoresis. No chest wall injury or strain. No fall. No heartburn. No cough or uri symptoms. No fever or chills. Denies leg pain or swelling. Denies similar recent chest pain. No exertional chest pain. No unusual doe or fatigue.   The history is provided by the patient, medical records and the EMS personnel.  Chest Pain Associated symptoms: no abdominal pain, no back pain, no cough, no fever, no headache, no nausea, no palpitations, no shortness of breath and no vomiting        Prior to Admission medications   Medication Sig Start Date End Date Taking? Authorizing Provider  acetaminophen  (TYLENOL ) 325 MG tablet Take 650 mg by mouth every 8 (eight) hours as needed for mild pain (pain score 1-3) or moderate pain (pain score 4-6).   Yes [provider]  bisacodyl (DULCOLAX) 10 MG suppository Place 10 mg rectally daily as needed for moderate constipation or mild constipation.   Yes [provider]  Carboxymethylcellulose Sodium (ARTIFICIAL TEARS OP) Apply 1 drop to eye in the morning and at bedtime.   Yes [provider]  diclofenac Sodium (VOLTAREN) 1 % GEL Apply 2 g topically in the morning and at bedtime. Apply to hands and wrists   Yes [provider]  ferrous sulfate 325 (65 FE) MG EC tablet Take 325 mg by mouth every other day.   Yes [provider]  lidocaine 4 % Place 1 patch onto the skin daily. Apply to left shoulder   Yes [provider]  magnesium  hydroxide (MILK OF MAGNESIA) 400 MG/5ML suspension Take 30 mLs by mouth daily as needed (if no BM in 3 days).   Yes [provider]  Multiple Vitamins-Minerals (MULTIVITAMIN WITH MINERALS) tablet Take 1 tablet by mouth daily.   Yes [provider]  polyethylene glycol powder (GLYCOLAX/MIRALAX) 17 GM/SCOOP powder Take 17 g by mouth every Monday, Wednesday, and Friday.   Yes [provider]  sennosides-docusate sodium (SENOKOT-S) 8.6-50 MG tablet Take 2 tablets by mouth in the morning and at bedtime.   Yes [provider]  simethicone  (MYLICON) 125 MG chewable tablet Chew 125 mg by mouth in the morning, at noon, and at bedtime.   Yes [provider]    Allergies: Erythromycin base, Ascorbate, Aspirin , Doxycycline, Nickel, Codeine, Mirtazapine, Tomato, Caffeine, and Ibuprofen    Review of Systems  Constitutional:  Negative for chills and fever.  HENT:  Negative for sore throat.   Respiratory:  Negative for cough and shortness of breath.   Cardiovascular:  Positive for chest pain. Negative for palpitations and leg swelling.  Gastrointestinal:  Negative for abdominal pain, nausea and vomiting.  Genitourinary:  Negative for flank pain.  Musculoskeletal:  Negative for back pain and neck pain.  Skin:  Negative for rash.  Neurological:  Negative for headaches.    Updated Vital Signs BP (!) 140/70   Pulse 92   Temp 98 F (36.7 C)   Resp 15   SpO2 98%   Physical Exam Vitals and nursing note reviewed.  Constitutional:      Appearance: Normal appearance. She is well-developed.  HENT:     Head: Atraumatic.     Nose: Nose normal.     Mouth/Throat:     Mouth: Mucous membranes are moist.  Eyes:     General: No scleral icterus.    Conjunctiva/sclera: Conjunctivae normal.  Neck:     Trachea: No tracheal deviation.  Cardiovascular:     Rate and Rhythm: Normal rate and regular rhythm.     Pulses: Normal pulses.     Heart sounds: Normal heart sounds. No murmur heard.    No friction rub. No gallop.  Pulmonary:     Effort: Pulmonary effort is normal. No respiratory distress.      Breath sounds: Normal breath sounds.     Comments: No chest mass, redness, swelling, or rash. Chest:     Chest wall: No tenderness.  Abdominal:     General: Bowel sounds are normal. There is no distension.     Palpations: Abdomen is soft.     Tenderness: There is no abdominal tenderness. There is no guarding.  Genitourinary:    Comments: No cva tenderness.  Musculoskeletal:        General: No swelling or tenderness.     Cervical back: Normal range of motion and neck supple. No rigidity. No muscular tenderness.     Right lower leg: No edema.     Left lower leg: No edema.  Skin:    General: Skin is warm and dry.     Findings: No rash.  Neurological:     Mental Status: She is alert.     Comments: Alert, speech normal.   Psychiatric:        Mood and Affect: Mood normal.     (all labs ordered are listed, but only abnormal results are displayed) Results for orders placed or performed during the hospital encounter of 02/26/24  Comprehensive metabolic panel with GFR   Collection Time: 02/26/24 11:28 AM  Result Value Ref Range   Sodium 136 135 - 145 mmol/L   Potassium 4.0 3.5 - 5.1 mmol/L   Chloride 102 98 - 111 mmol/L   CO2 24 22 - 32 mmol/L   Glucose, Bld 111 (H) 70 - 99 mg/dL   BUN 10 8 - 23 mg/dL   Creatinine, Ser 9.57 (L) 0.44 - 1.00 mg/dL   Calcium 89.8 8.9 - 89.6 mg/dL   Total Protein 7.2 6.5 - 8.1 g/dL   Albumin 2.6 (L) 3.5 - 5.0 g/dL   AST 17 15 - 41 U/L   ALT 9 0 - 44 U/L   Alkaline Phosphatase 51 38 - 126 U/L   Total Bilirubin 0.3 0.0 - 1.2 mg/dL   GFR, Estimated >39 >39 mL/min   Anion gap 10 5 - 15  Troponin I (High Sensitivity)   Collection Time: 02/26/24 11:28 AM  Result Value Ref Range   Troponin I (High Sensitivity) 9 <18 ng/L      EKG: EKG Interpretation Date/Time:  Tuesday February 26 2024 11:23:28 EDT Ventricular Rate:  102 PR Interval:  147 QRS Duration:  132 QT Interval:  373 QTC Calculation: 486 R Axis:   55  Text Interpretation: Sinus  tachycardia Left bundle branch block No significant change since last tracing Confirmed by Bernard Drivers ([id number]) on 02/26/2024 12:31:19 PM  Radiology: [facility] Chest Port 1 View Result Date: 02/26/2024 CLINICAL DATA:  Chest pain. EXAM: PORTABLE CHEST 1 VIEW COMPARISON:  04/04/2020. FINDINGS: Bilateral lungs appear hyperexpanded and hyperlucent with coarse bronchovascular markings, in keeping with COPD. Bilateral lungs otherwise  appear clear. No dense consolidation or lung collapse. Bilateral costophrenic angles are clear. Normal cardio-mediastinal silhouette. No acute osseous abnormalities. The soft tissues are within normal limits. IMPRESSION: No active disease. COPD. Electronically Signed   By: Ree Molt M.D.   On: 02/26/2024 12:21     Procedures   Medications Ordered in the ED  acetaminophen  (TYLENOL ) tablet 650 mg (650 mg Oral Given 02/26/24 1229)  alum & mag hydroxide-simeth (MAALOX/MYLANTA) 200-200-20 MG/5ML suspension 30 mL (30 mLs Oral Given 02/26/24 1229)  famotidine  (PEPCID ) tablet 20 mg (20 mg Oral Given 02/26/24 1229)                                    Medical Decision Making Problems Addressed: Hypoalbuminemia: chronic illness or injury Precordial chest pain: acute illness or injury with systemic symptoms that poses a threat to life or bodily functions  Amount and/or Complexity of Data Reviewed Independent Historian: EMS    Details: hx External Data Reviewed: notes. Labs: ordered. Decision-making details documented in ED Course. Radiology: ordered and independent interpretation performed. Decision-making details documented in ED Course. ECG/medicine tests: ordered and independent interpretation performed. Decision-making details documented in ED Course.  Risk OTC drugs. Decision regarding hospitalization.   Iv ns. Continuous pulse ox and cardiac monitoring. Labs ordered/sent. Imaging ordered.   Differential diagnosis includes acs, msk cp, gi cp, etc. Dispo decision  including potential need for admission considered - will get labs and imaging and reassess.   Reviewed nursing notes and prior charts for additional history. External reports reviewed. Additional history from: EMS.   Acetaminophen  po, pepcid  po, maalox po.   Cardiac monitor: sinus rhythm, rate 74.  Labs reviewed/interpreted by me - initial trop normal. Chem unremarkable.   Xrays reviewed/interpreted by me -  no pna.   Recheck pt, no chest pain, no increased wob or sob. Pulse ox 100%. Delta trop and other labs pending.   1529 labs pending, signed out to Dr Avonne - if delta trop normal, not increasing, pt remains cp free, feel likely stable for d/c with close outpatient cardiology f/u.  If delta trop abn/increasing, would admit.       Final diagnoses:  Precordial chest pain  Hypoalbuminemia    ED Discharge Orders     None          Bernard Drivers, MD 02/26/24 1530

## 2024-02-26 NOTE — ED Provider Notes (Signed)
 Plan to follow-up troponin CBC.  Patient here with nonspecific chest pain.  She has no complaints upon my evaluation.  Repeat troponin is normal.  Overall sounds like something happened with her CBC.  This needed to be redrawn but patient did not want to stay any longer.  She understood the risks and benefits of not getting this blood work checked including to see if she had severe anemia.  She states that she recently had blood work done and said that her labs are normal.  Ultimately I strongly encouraged her to stay for the CBC.  We talked at length about this.  She understood the risks and benefits.  Patient was discharged.  She has capacity make this decision.  This chart was dictated using voice recognition software.  Despite best efforts to proofread,  errors can occur which can change the documentation meaning.    Ruthe Cornet, DO 02/26/24 1742

## 2024-02-26 NOTE — Discharge Instructions (Addendum)
 As I recommended, I thought you should have stayed for your CBC.  I wanted to make sure that you are hemoglobin count was normal and your other labs were unremarkable.  Otherwise we recommend that you follow-up with your primary care doctor.  Follow instructions below as well.  It was our pleasure to provide your ER care today - we hope that you feel better.  If GI/reflux symptoms, try taking pepcid  and maalox as need for symptom relief.   For recent chest pain, follow up closely with cardiologist in the next 1-2 weeks - we made referral, and they should be contacting you with an appointment in the next few days.   Return to ER right away if worse, new symptoms, fevers, recurrent/persistent chest pain, increased trouble breathing, or other concern.

## 2024-02-26 NOTE — ED Notes (Signed)
 Pt alert, NAD, calm, interactive. Updated. Meal ordered. Pt speaking on phone with family.

## 2024-02-26 NOTE — ED Notes (Signed)
 167/78 104 HR 16RR
